# Patient Record
Sex: Female | Born: 1990 | Marital: Single | Smoking: Never smoker
Health system: Southern US, Community
[De-identification: ages and names within clinical notes are randomized; demographics above are authoritative.]

## PROBLEM LIST (undated history)

## (undated) DIAGNOSIS — F988 Other specified behavioral and emotional disorders with onset usually occurring in childhood and adolescence: Secondary | ICD-10-CM

## (undated) HISTORY — DX: Other specified behavioral and emotional disorders with onset usually occurring in childhood and adolescence: F98.8

## (undated) HISTORY — PX: WISDOM TOOTH EXTRACTION: SHX21

---

## 2011-08-22 ENCOUNTER — Telehealth: Payer: Self-pay

## 2011-08-22 NOTE — Telephone Encounter (Signed)
Spoke with pt Laura [name] msg pt wants refill on bc junel pt states missed a week of pills informed pt will consult ND and call her back pt voice understanding

## 2011-08-26 ENCOUNTER — Other Ambulatory Visit: Payer: Self-pay | Admitting: Obstetrics and Gynecology

## 2011-08-26 NOTE — Telephone Encounter (Signed)
Laura [name]/vph pt

## 2011-08-28 NOTE — Telephone Encounter (Signed)
Chart on your desk.

## 2011-08-28 NOTE — Telephone Encounter (Signed)
I need to see the patients chart.

## 2011-08-29 NOTE — Telephone Encounter (Signed)
Pt may have a refill on her BC.  She needs to start with her nenses and use back up for one month.  Refill for three months and she should have a follow up visit . Thanks

## 2011-09-03 ENCOUNTER — Other Ambulatory Visit: Payer: Self-pay

## 2011-09-03 ENCOUNTER — Telehealth: Payer: Self-pay

## 2011-09-03 MED ORDER — NORETHIN ACE-ETH ESTRAD-FE 1-20 MG-MCG PO TABS: 1.0000 | ORAL_TABLET | Freq: Every day | ORAL | Status: DC

## 2011-09-03 NOTE — Telephone Encounter (Signed)
Tc from pt pharmacy pt wants three months supply of bc due to insurance rx sent to pharm 3 month supply

## 2011-09-03 NOTE — Telephone Encounter (Signed)
Spoke with pt informed per ND ok to refill bc take with start of cycle use bum f/u 3-4 months pt voice understanding

## 2011-10-22 ENCOUNTER — Other Ambulatory Visit: Payer: Self-pay | Admitting: Obstetrics and Gynecology

## 2012-01-29 ENCOUNTER — Ambulatory Visit (INDEPENDENT_AMBULATORY_CARE_PROVIDER_SITE_OTHER): Payer: BC Managed Care – PPO | Admitting: Obstetrics and Gynecology

## 2012-01-29 ENCOUNTER — Encounter: Payer: Self-pay | Admitting: Obstetrics and Gynecology

## 2012-01-29 VITALS — BP 90/60 | Wt 112.0 lb

## 2012-01-29 DIAGNOSIS — N926 Irregular menstruation, unspecified: Secondary | ICD-10-CM

## 2012-01-29 DIAGNOSIS — B373 Candidiasis of vulva and vagina: Secondary | ICD-10-CM

## 2012-01-29 DIAGNOSIS — B3731 Acute candidiasis of vulva and vagina: Secondary | ICD-10-CM

## 2012-01-29 LAB — POCT WET PREP (WET MOUNT)
Clue Cells Wet Prep Whiff POC: POSITIVE
Trichomonas Wet Prep HPF POC: NEGATIVE

## 2012-01-29 MED ORDER — FLUCONAZOLE 150 MG PO TABS: 150.0000 mg | ORAL_TABLET | Freq: Once | ORAL | Status: DC

## 2012-01-29 MED ORDER — CLOTRIMAZOLE 1 % VA CREA: 1.0000 | TOPICAL_CREAM | Freq: Two times a day (BID) | VAGINAL | Status: DC

## 2012-01-29 NOTE — Progress Notes (Signed)
F/u birth control, pt c/o breakthrough bleeding. She has only missed one pill and it has been present for three months.  No pelvic pain.  She c/o itching and vulvar irritation with yellow discharge.       BP 90/60  Wt 112 lb (50.803 kg)  LMP 01/18/2012 Physical Examination: General appearance - alert, well appearing, and in no distress Chest - clear to auscultation, no wheezes, rales or rhonchi, symmetric air entry Heart - normal rate and regular rhythm Abdomen - soft, nontender, nondistended, no masses or organomegaly Pelvic - VULVA: normal appearing vulva with no masses, tenderness or lesions, VAGINA: vaginal discharge - copious, curd-like and green, WET MOUNT done - results: KOH done, lactobacilli, monilia, CERVIX: normal appearing cervix without discharge or lesions, UTERUS: uterus is normal size, shape, consistency and nontender, ADNEXA: normal adnexa in size, nontender and no masses Yeast vaginits Diflucan rx gc and chlam done HSV cx taken of vulva wher it was red and irritated.   Pt will call back if seh has any further abnormal bleeding in the next month

## 2012-01-29 NOTE — Patient Instructions (Signed)
Monilial Vaginitis Vaginitis in a soreness, swelling and redness (inflammation) of the vagina and vulva. Monilial vaginitis is not a sexually transmitted infection. CAUSES  Yeast vaginitis is caused by yeast (candida) that is normally found in your vagina. With a yeast infection, the candida has overgrown in number to a point that upsets the chemical balance. SYMPTOMS   White, thick vaginal discharge.  Swelling, itching, redness and irritation of the vagina and possibly the lips of the vagina (vulva).  Burning or painful urination.  Painful intercourse. DIAGNOSIS  Things that may contribute to monilial vaginitis are:  Postmenopausal and virginal states.  Pregnancy.  Infections.  Being tired, sick or stressed, especially if you had monilial vaginitis in the past.  Diabetes. Good control will help lower the chance.  Birth control pills.  Tight fitting garments.  Using bubble bath, feminine sprays, douches or deodorant tampons.  Taking certain medications that kill germs (antibiotics).  Sporadic recurrence can occur if you become ill. TREATMENT  Your caregiver will give you medication.  There are several kinds of anti monilial vaginal creams and suppositories specific for monilial vaginitis. For recurrent yeast infections, use a suppository or cream in the vagina 2 times a week, or as directed.  Anti-monilial or steroid cream for the itching or irritation of the vulva may also be used. Get your caregiver's permission.  Painting the vagina with methylene blue solution may help if the monilial cream does not work.  Eating yogurt may help prevent monilial vaginitis. HOME CARE INSTRUCTIONS   Finish all medication as prescribed.  Do not have sex until treatment is completed or after your caregiver tells you it is okay.  Take warm sitz baths.  Do not douche.  Do not use tampons, especially scented ones.  Wear cotton underwear.  Avoid tight pants and panty  hose.  Tell your sexual partner that you have a yeast infection. They should go to their caregiver if they have symptoms such as mild rash or itching.  Your sexual partner should be treated as well if your infection is difficult to eliminate.  Practice safer sex. Use condoms.  Some vaginal medications cause latex condoms to fail. Vaginal medications that harm condoms are:  Cleocin cream.  Butoconazole (Femstat).  Terconazole (Terazol) vaginal suppository.  Miconazole (Monistat) (may be purchased over the counter). SEEK MEDICAL CARE IF:   You have a temperature by mouth above 102 F (38.9 C).  The infection is getting worse after 2 days of treatment.  The infection is not getting better after 3 days of treatment.  You develop blisters in or around your vagina.  You develop vaginal bleeding, and it is not your menstrual period.  You have pain when you urinate.  You develop intestinal problems.  You have pain with sexual intercourse. Document Released: 11/06/2004 Document Revised: 04/21/2011 Document Reviewed: 07/21/2008 ExitCare Patient Information 2013 [employer].  

## 2012-01-30 LAB — GC/CHLAMYDIA PROBE AMP: GC Probe RNA: NEGATIVE

## 2012-02-05 ENCOUNTER — Telehealth: Payer: Self-pay

## 2012-02-05 ENCOUNTER — Telehealth: Payer: Self-pay | Admitting: Obstetrics and Gynecology

## 2012-02-05 NOTE — Telephone Encounter (Signed)
Lm on vm tcb rgd labs 

## 2012-02-05 NOTE — Telephone Encounter (Signed)
Message copied by Rolla Plate on Thu Feb 05, 2012  4:14 PM ------      Message from: Jaymes Graff      Created: Thu Feb 05, 2012  2:39 PM       Please call pt with results.  Thank you

## 2012-02-06 ENCOUNTER — Telehealth: Payer: Self-pay | Admitting: Obstetrics and Gynecology

## 2012-02-06 NOTE — Telephone Encounter (Signed)
Lm on vm tcb rgd labs 

## 2012-02-09 ENCOUNTER — Telehealth: Payer: Self-pay | Admitting: Obstetrics and Gynecology

## 2012-02-09 NOTE — Telephone Encounter (Signed)
Tc to pt. Re: test results. Informed pt that her hsv culture and gc/ct were all negative. Pt voiced understanding.

## 2012-02-13 ENCOUNTER — Telehealth: Payer: Self-pay | Admitting: Obstetrics and Gynecology

## 2012-02-13 ENCOUNTER — Other Ambulatory Visit: Payer: Self-pay | Admitting: Obstetrics and Gynecology

## 2012-02-13 NOTE — Telephone Encounter (Signed)
Spoke with pt rgd msg pt sates still having break thru bleeding wants to switch bc advised pt will consult  With nd and call her back pt voice understanding

## 2012-02-19 ENCOUNTER — Other Ambulatory Visit: Payer: Self-pay | Admitting: Obstetrics and Gynecology

## 2012-02-19 MED ORDER — NORETHIN ACE-ETH ESTRAD-FE 1-20 MG-MCG PO TABS: 1.0000 | ORAL_TABLET | Freq: Every day | ORAL | Status: DC

## 2012-02-19 NOTE — Telephone Encounter (Signed)
Spoke with pt rgd msg pt wants refill on bc and diflucan advised pt can refill bc need apt for diflucan or can try monistat otc pt will try monistat

## 2012-08-09 ENCOUNTER — Ambulatory Visit (INDEPENDENT_AMBULATORY_CARE_PROVIDER_SITE_OTHER): Payer: BC Managed Care – PPO | Admitting: Family Medicine

## 2012-08-09 VITALS — BP 102/64 | HR 81 | Temp 98.0°F | Resp 17 | Ht 62.0 in | Wt 114.0 lb

## 2012-08-09 DIAGNOSIS — Z283 Underimmunization status: Secondary | ICD-10-CM

## 2012-08-09 DIAGNOSIS — Z2839 Other underimmunization status: Secondary | ICD-10-CM

## 2012-08-09 DIAGNOSIS — Z111 Encounter for screening for respiratory tuberculosis: Secondary | ICD-10-CM

## 2012-08-09 NOTE — Progress Notes (Signed)
[facility]:  Office Visit  Chief Complaint:  Chief Complaint  Patient presents with  . Annual Exam    for school     HPI: Laura [name] is a 22 y.o. 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 Starts October 04, 2012.  No complaints, doing well.   History reviewed. No pertinent past medical history. History reviewed. No pertinent past surgical history. History   Social History  . Marital Status: Single    Spouse Name: N/A    Number of Children: N/A  . Years of Education: N/A   Social History Main Topics  . Smoking status: Never Smoker   . Smokeless tobacco: None  . Alcohol Use: No  . Drug Use: No  . Sexually Active: Yes    Birth Control/ Protection: Pill     Comment: junel    Other Topics Concern  . None   Social History Narrative  . None   Family History  Problem Relation Age of Onset  . Diabetes Maternal Grandmother    No Known Allergies Prior to Admission medications   Medication Sig Start Date End Date Taking? Authorizing Provider  BIOTIN PO Take by mouth.   Yes Historical Provider, MD  norethindrone-ethinyl estradiol (JUNEL FE 1/20) 1-20 MG-MCG tablet Take 1 tablet by mouth daily. 02/19/12 02/18/13 Yes Naima A Dillard, MD     ROS: The patient denies fevers, chills, night sweats, unintentional weight loss, chest pain, palpitations, wheezing, dyspnea on exertion, nausea, vomiting, abdominal pain, dysuria, hematuria, melena, numbness, weakness, or tingling.   All other systems have been reviewed and were otherwise negative with the exception of those mentioned in the HPI and as above.    PHYSICAL EXAM: Filed Vitals:   08/09/12 1720  BP: 102/64  Pulse: 81  Temp: 98 F (36.7 C)  Resp: 17   Filed Vitals:   08/09/12 1720  Height: 5\' 2"  (1.575 m)  Weight: 114 lb (51.71 kg)   Body mass index is 20.85 kg/(m^2).  General: Alert, no acute distress HEENT:  Normocephalic, atraumatic, oropharynx patent.  EOMI, PERRLA, fundoscopic exam normal.  Cardiovascular:  Regular rate and rhythm, no rubs murmurs or gallops.  No Carotid bruits, radial pulse intact. No pedal edema.  Respiratory: Clear to auscultation bilaterally.  No wheezes, rales, or rhonchi.  No cyanosis, no use of accessory musculature GI: No organomegaly, abdomen is soft and non-tender, positive bowel sounds.  No masses. Skin: No rashes. Neurologic: Facial musculature symmetric. Psychiatric: Patient is appropriate throughout our interaction. Lymphatic: No cervical lymphadenopathy Musculoskeletal: Gait intact. 5/5, no scoliosis.    LABS: Results for orders placed in visit on 01/29/12  GC/CHLAMYDIA PROBE AMP      Result Value Range   CT Probe RNA NEGATIVE     GC Probe RNA NEGATIVE    HERPES SIMPLEX VIRUS CULTURE      Result Value Range   Organism ID, Bacteria No Herpes Simplex Virus detected.    POCT URINE PREGNANCY      Result Value Range   Preg Test, Ur Negative    POCT WET PREP (WET MOUNT)      Result Value Range   Source Wet Prep POC       WBC, Wet Prep HPF POC       Bacteria Wet Prep HPF POC       BACTERIA WET PREP MORPHOLOGY POC       Clue Cells Wet Prep HPF POC  CLUE CELLS WET PREP WHIFF POC Positive Whiff     Yeast Wet Prep HPF POC Moderate     KOH Wet Prep POC negative     Trichomonas Wet Prep HPF POC negative      Tuberculosis Risk Questionnaire  1. Were you born outside the [place] in one of the following parts of the world: [place], [place], [place], [place] or [place]?  No  2. Have you traveled outside the [place] and lived for more than one month in one of the following parts of the world: [place], [place], [place], [place] or [place]?  No  3. Do you have a compromised immune system such as from any of the following conditions:HIV/AIDS, organ or bone marrow transplantation, diabetes, immunosuppressive medicines (e.g. Prednisone, Remicaide), leukemia, lymphoma, cancer of  the head or neck, gastrectomy or jejunal bypass, end-stage renal disease (on dialysis), or silicosis?  No   4. Have you ever or do you plan on working in: a residential care center, a health care facility, a jail or prison or homeless shelter?  Yes    5. Have you ever: injected illegal drugs, used crack cocaine, lived in a homeless shelter  or been in jail or prison?   No  6. Have you ever been exposed to anyone with infectious tuberculosis?  No   Tuberculosis Symptom Questionnaire  Do you currently have any of the following symptoms?  1. Unexplained cough lasting more than 3 weeks? No  2. Unexplained fever lasting more than 3 weeks. No   3. Night Sweats (sweating that leaves the bedclothes and sheets wet)   No  4. Shortness of Breath No  5. Chest Pain No  6. Unintentional weight loss  No  7. Unexplained fatigue (very tired for no reason) No    EKG/XRAY:   Primary read interpreted by Dr. Conley Rolls at [facility].   ASSESSMENT/PLAN: Encounter Diagnoses  Name Primary?  . Screening-pulmonary TB Yes  . Immunization deficiency    PPD test placed She will get Hep B titers She will return in 48-72 hrs, then once she is done she will return for anotehr TB test in 2 weeks.  F/u as directed    LE, THAO PHUONG, DO 08/09/2012 6:44 PM

## 2012-08-10 LAB — HEPATITIS B SURFACE ANTIGEN: Hepatitis B Surface Ag: NEGATIVE

## 2012-08-11 ENCOUNTER — Ambulatory Visit: Payer: BC Managed Care – PPO

## 2012-08-11 LAB — HEPATITIS B SURFACE ANTIBODY, QUANTITATIVE: Hep B S AB Quant (Post): 261.2 m[IU]/mL

## 2012-08-12 ENCOUNTER — Ambulatory Visit (INDEPENDENT_AMBULATORY_CARE_PROVIDER_SITE_OTHER): Payer: BC Managed Care – PPO | Admitting: *Deleted

## 2012-08-12 DIAGNOSIS — Z111 Encounter for screening for respiratory tuberculosis: Secondary | ICD-10-CM

## 2012-08-12 LAB — TB SKIN TEST
Induration: 0 mm
TB Skin Test: NEGATIVE

## 2012-08-26 ENCOUNTER — Ambulatory Visit (INDEPENDENT_AMBULATORY_CARE_PROVIDER_SITE_OTHER): Payer: BC Managed Care – PPO | Admitting: *Deleted

## 2012-08-26 DIAGNOSIS — Z111 Encounter for screening for respiratory tuberculosis: Secondary | ICD-10-CM

## 2012-08-26 NOTE — Progress Notes (Signed)
  Subjective:    Patient ID: Laura [name], female    DOB: 09-04-1990, 22 y.o.   MRN: [id number]  HPI Patient here today for a second step TB skin test. Pt has not had any positive test in the past. Pt advised to return Saturday 7/19 for the reading. She will have paperwork for school that will need to be filled out at her reading.   PPD placed on her left forearm.    Review of Systems     Objective:   Physical Exam        Assessment & Plan:

## 2012-08-28 ENCOUNTER — Ambulatory Visit (INDEPENDENT_AMBULATORY_CARE_PROVIDER_SITE_OTHER): Payer: BC Managed Care – PPO | Admitting: *Deleted

## 2012-08-28 DIAGNOSIS — Z111 Encounter for screening for respiratory tuberculosis: Secondary | ICD-10-CM

## 2012-08-28 LAB — TB SKIN TEST
Induration: 0 mm
TB Skin Test: NEGATIVE

## 2012-09-16 ENCOUNTER — Telehealth: Payer: Self-pay

## 2012-09-16 NOTE — Telephone Encounter (Signed)
Did you keep a copy to scan?

## 2012-09-16 NOTE — Telephone Encounter (Signed)
Advised mother of dates TB skin tests were performed

## 2012-09-16 NOTE — Telephone Encounter (Signed)
I do not have her immunization records.

## 2012-09-16 NOTE — Telephone Encounter (Signed)
Patient needs her immunizations expiration dates for completion of her college papers.   [phone]

## 2012-09-17 ENCOUNTER — Telehealth: Payer: Self-pay

## 2012-09-17 NOTE — Telephone Encounter (Signed)
Was advised yesterday PPD good for 1 yr, I think she needs immunization review. She indicates she is asking about the Tdap, I advised her I have no record of this.

## 2012-09-17 NOTE — Telephone Encounter (Signed)
Pt requesting information about immunizations and when they expire,etc   Best phone for pt is [phone] or moms number [phone]

## 2013-01-11 ENCOUNTER — Telehealth: Payer: Self-pay

## 2013-01-11 NOTE — Telephone Encounter (Signed)
She is immune to Hep B, called her to advise. Left message

## 2013-01-11 NOTE — Telephone Encounter (Signed)
Patient calling to get her titer results that were done back in June  [phone]

## 2013-07-01 ENCOUNTER — Ambulatory Visit (INDEPENDENT_AMBULATORY_CARE_PROVIDER_SITE_OTHER): Payer: BC Managed Care – PPO | Admitting: Internal Medicine

## 2013-07-01 VITALS — BP 100/68 | HR 75 | Temp 98.3°F | Resp 16 | Ht 62.0 in | Wt 113.0 lb

## 2013-07-01 DIAGNOSIS — F909 Attention-deficit hyperactivity disorder, unspecified type: Secondary | ICD-10-CM

## 2013-07-01 MED ORDER — AMPHETAMINE-DEXTROAMPHETAMINE 10 MG PO TABS: 10.0000 mg | ORAL_TABLET | Freq: Two times a day (BID) | ORAL | Status: DC

## 2013-07-01 NOTE — Progress Notes (Signed)
Subjective:    Patient ID: Laura [name], female    DOB: July 28, 1990, 23 y.o.   MRN: [id number] This chart was scribed for Laura Sia, MD by Valera Castle, ED Scribe. This patient was seen in room 01 and the patient's care was started at 2:44 PM.  Chief Complaint  Patient presents with  . ADD   HPI Laura [name] is a 23 y.o. female Pt reports with decreased concentration, onset several years ago.   She states she has a hard time focusing on one thing, states she will forget she is taking her tests. She reports running out of time with her tests sometimes. She states she sometimes has trouble studying at night, staying she stays up too late. She reports her body is growing accustomed to decreased sleep. She denies any sleep issues.   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 She denies having decreased concentration in high school, but states she started noticing her symptoms while attending college. She reports a small drop in her GPA then, but denies a substantial drop.   She states she is working in [place] during the Summer at [employer], denies having as many issues with concentration while at work, stating her employers are keeping track of her productivity more. She states for fun she spends time with her family and friends, visits downtown [place]. She denies reading much, stating she will often loose track of what she is reading. She reports being very fidgety while trying to sit still. She denies any of her friends saying she stops listening to them.   She has a brother and father without h/o ADD. She reports her mother has h/o similar symptoms.   She denies any other health issues.   PCP - Pcp Not In System  There are no active problems to display for this patient.  Prior to Admission medications   Medication Sig Start Date End Date Taking? Authorizing Provider  BIOTIN PO Take by mouth.   Yes  Historical Provider, MD  norethindrone-ethinyl estradiol (MICROGESTIN,JUNEL,LOESTRIN) 1-20 MG-MCG tablet Take 1 tablet by mouth daily.   Yes Historical Provider, MD  norethindrone-ethinyl estradiol (JUNEL FE 1/20) 1-20 MG-MCG tablet Take 1 tablet by mouth daily. 02/19/12 02/18/13  Michael Litter, MD   Review of Systems  Constitutional: Negative for fever.  Psychiatric/Behavioral: Positive for decreased concentration. Negative for sleep disturbance and agitation. The patient is not nervous/anxious.    exercise patterns very good Dietary patterns good   Objective:   Physical Exam  Nursing note and vitals reviewed. Constitutional: She is oriented to person, place, and time. She appears well-developed and well-nourished. No distress.  HENT:  Head: Normocephalic and atraumatic.  Eyes: Conjunctivae and EOM are normal. Pupils are equal, round, and reactive to light.  Neck: Neck supple. No thyromegaly present.  Cardiovascular: Normal rate, regular rhythm, normal heart sounds and intact distal pulses.   No murmur heard. Pulmonary/Chest: Effort normal and breath sounds normal. No respiratory distress. She has no wheezes. She has no rales.  Musculoskeletal: Normal range of motion. She exhibits no tenderness.  Lymphadenopathy:    She has no cervical adenopathy.  Neurological: She is alert and oriented to person, place, and time. No cranial nerve deficit.  Skin: Skin is warm and dry.  Psychiatric: She has a normal mood and affect. Her behavior is normal.   BP 100/68  Pulse 75  Temp(Src) 98.3 F (36.8 C) (Oral)  Resp 16  Ht  5\' 2"  (1.575 m)  Wt 113 lb (51.256 kg)  BMI 20.66 kg/m2  SpO2 100%  LMP 06/01/2013  Adult self-report scale for ADHD scanned- very positive at 1452 with an endorsement of hyperactivity as well as attention problems that are very significant    Assessment & Plan:  ADHD (attention deficit hyperactivity disorder)  Meds ordered this encounter  Medications  .  norethindrone-ethinyl estradiol (MICROGESTIN,JUNEL,LOESTRIN) 1-20 MG-MCG tablet    Sig: Take 1 tablet by mouth daily.  Marland Kitchen. amphetamine-dextroamphetamine (ADDERALL) 10 MG tablet    Sig: Take 1 tablet (10 mg total) by mouth 2 (two) times daily with a meal.    Dispense:  60 tablet    Refill:  0  . amphetamine-dextroamphetamine (ADDERALL) 10 MG tablet    Sig: Take 1 tablet (10 mg total) by mouth 2 (two) times daily with a meal. For 30d from date signed    Dispense:  60 tablet    Refill:  0  . amphetamine-dextroamphetamine (ADDERALL) 10 MG tablet    Sig: Take 1 tablet (10 mg total) by mouth 2 (two) times daily with a meal. For 60 d from signing    Dispense:  60 tablet    Refill:  0   She will self tit[place] in [place] this [place]/and working as an Tax inspectorintern at [employer], and will communicate with me through my chart or telephone. Planned followup August or September 2015 for further dose adjustments. Her mother lives here and may be a conduit for medication if needed.     I have completed the patient encounter in its entirety as documented by the scribe, with editing by me where necessary. Quanisha Drewry P. Merla Richesoolittle, M.D.

## 2013-07-02 DIAGNOSIS — F909 Attention-deficit hyperactivity disorder, unspecified type: Secondary | ICD-10-CM | POA: Insufficient documentation

## 2013-07-06 ENCOUNTER — Telehealth: Payer: Self-pay

## 2013-07-06 NOTE — Telephone Encounter (Signed)
Patient is calling regarding her adderall rx. Patient states the pharmacy told her that the doctor has not signed off on the refill. Please return call and advise. Thank you.

## 2013-07-07 NOTE — Telephone Encounter (Signed)
Lm for rtn call 

## 2013-07-07 NOTE — Telephone Encounter (Signed)
Look at 5/22 ov---not sure what she means

## 2013-07-12 NOTE — Telephone Encounter (Signed)
Pt clarified that a PA is needed for ins coverage. I advised pt that I have not gotten a fax from pharm w/ins info and she stated that she will call them back and have them fax it to me.

## 2013-07-12 NOTE — Telephone Encounter (Signed)
PA approved for 36 mos. Notified pt and pharmacy.

## 2014-01-31 ENCOUNTER — Ambulatory Visit (INDEPENDENT_AMBULATORY_CARE_PROVIDER_SITE_OTHER): Payer: BC Managed Care – PPO | Admitting: Family Medicine

## 2014-01-31 VITALS — BP 110/60 | HR 85 | Temp 98.6°F | Resp 16 | Ht 63.0 in | Wt 114.6 lb

## 2014-01-31 DIAGNOSIS — F900 Attention-deficit hyperactivity disorder, predominantly inattentive type: Secondary | ICD-10-CM

## 2014-01-31 MED ORDER — AMPHETAMINE-DEXTROAMPHETAMINE 10 MG PO TABS: 10.0000 mg | ORAL_TABLET | Freq: Two times a day (BID) | ORAL | Status: DC

## 2014-01-31 NOTE — Patient Instructions (Signed)
Good to see you today!  Take care and let us know if you have any concerns  Best of luck with the rest of your schooling!

## 2014-01-31 NOTE — Progress Notes (Signed)
[facility] [address], [place] [id number] [phone]  Date:  01/31/2014   Name:  Laura [name]   DOB:  1990-09-08   MRN:  [id number]  PCP:  Pcp Not In System    Chief Complaint: Medication Refill   History of Present Illness:  Laura [name] is a 23 y.o. very pleasant female patient who presents with the following:  Here today seeking an adderall refill.  History of ADHD- she has been on adderall for a few months.   She does not use it every day- the 3 rx she was given in May have lasted until now.   She does feel like the medication is helping her.  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  She is is here visiting her family She is generally in good health  BP Readings from Last 3 Encounters:  01/31/14 94/44  07/01/13 100/68  08/09/12 102/64    Patient Active Problem List   Diagnosis Date Noted  . ADHD (attention deficit hyperactivity disorder) 07/02/2013    No past medical history on file.  No past surgical history on file.  History  Substance Use Topics  . Smoking status: Never Smoker   . Smokeless tobacco: Not on file  . Alcohol Use: No    Family History  Problem Relation Age of Onset  . Diabetes Maternal Grandmother     No Known Allergies  Medication list has been reviewed and updated.  Current Outpatient Prescriptions on File Prior to Visit  Medication Sig Dispense Refill  . amphetamine-dextroamphetamine (ADDERALL) 10 MG tablet Take 1 tablet (10 mg total) by mouth 2 (two) times daily with a meal. 60 tablet 0  . amphetamine-dextroamphetamine (ADDERALL) 10 MG tablet Take 1 tablet (10 mg total) by mouth 2 (two) times daily with a meal. For 30d from date signed 60 tablet 0  . amphetamine-dextroamphetamine (ADDERALL) 10 MG tablet Take 1 tablet (10 mg total) by mouth 2 (two) times daily with a meal. For 60 d from signing 60 tablet 0  . BIOTIN PO Take by mouth.    .  norethindrone-ethinyl estradiol (MICROGESTIN,JUNEL,LOESTRIN) 1-20 MG-MCG tablet Take 1 tablet by mouth daily.    . norethindrone-ethinyl estradiol (JUNEL FE 1/20) 1-20 MG-MCG tablet Take 1 tablet by mouth daily. 3 Package 0   No current facility-administered medications on file prior to visit.    Review of Systems:  As per HPI- otherwise negative.   Physical Examination: Filed Vitals:   01/31/14 1251  BP: 94/44  Pulse: 85  Temp: 98.6 F (37 C)  Resp: 16   Filed Vitals:   01/31/14 1251  Height: 5\' 3"  (1.6 m)  Weight: 114 lb 9.6 oz (51.982 kg)   Body mass index is 20.31 kg/(m^2). Ideal Body Weight: Weight in (lb) to have BMI = 25: 140.8  GEN: WDWN, NAD, Non-toxic, A & O x 3, slim build HEENT: Atraumatic, Normocephalic. Neck supple. No masses, No LAD. Ears and Nose: No external deformity. CV: RRR, No M/G/R. No JVD. No thrill. No extra heart sounds. PULM: CTA B, no wheezes, crackles, rhonchi. No retractions. No resp. distress. No accessory muscle use. EXTR: No c/c/e NEURO Normal gait.  PSYCH: Normally interactive. Conversant. Not depressed or anxious appearing.  Calm demeanor.    Assessment and Plan: Attention deficit hyperactivity disorder (ADHD), predominantly inattentive type - Plan: amphetamine-dextroamphetamine (ADDERALL) 10 MG tablet, amphetamine-dextroamphetamine (ADDERALL) 10 MG  tablet, amphetamine-dextroamphetamine (ADDERALL) 10 MG tablet  Refilled her adderall for 3 months, she is doing well with her current dose.  She will call in about 3 months for RF, face to face in 6 months   Signed Abbe AmsterdamJessica Copland, MD

## 2014-04-12 ENCOUNTER — Telehealth: Payer: Self-pay

## 2014-04-12 DIAGNOSIS — F900 Attention-deficit hyperactivity disorder, predominantly inattentive type: Secondary | ICD-10-CM

## 2014-04-12 NOTE — Telephone Encounter (Signed)
Will you replace the lost Rx? Or should she wait until it is due?

## 2014-04-12 NOTE — Telephone Encounter (Signed)
Patient lost her adderall prescription.   Needs another one now - her mom can pick up the script along with the next three months.   [phone]

## 2014-04-13 NOTE — Telephone Encounter (Signed)
Called her back.   I am willing to write 3 more months total, but she will then have to be seen.  Advised her that she cannot lose any further rx; I will not replace again. Her mother will come by to pick up the rx tomorrow

## 2014-04-14 MED ORDER — AMPHETAMINE-DEXTROAMPHETAMINE 10 MG PO TABS: 10.0000 mg | ORAL_TABLET | Freq: Two times a day (BID) | ORAL | Status: DC

## 2014-08-31 ENCOUNTER — Ambulatory Visit (INDEPENDENT_AMBULATORY_CARE_PROVIDER_SITE_OTHER): Payer: BLUE CROSS/BLUE SHIELD | Admitting: Family Medicine

## 2014-08-31 VITALS — BP 110/62 | HR 76 | Temp 98.0°F | Resp 18 | Ht 62.5 in | Wt 116.0 lb

## 2014-08-31 DIAGNOSIS — F909 Attention-deficit hyperactivity disorder, unspecified type: Secondary | ICD-10-CM

## 2014-08-31 NOTE — Progress Notes (Signed)
  Subjective:  Patient ID: Laura [name], female    DOB: Oct 29, 1990  Age: 24 y.o. MRN: [id number]  Patient has not seen Dr. Merla [name] for over a year. He was the original prescriber. We talked a long time about the fact that I can only give a 30 day prescription, she needs to see him to get sufficient refills to tide her through until Christmas time. She will see him tomorrow.   Objective:   .  Assessment & Plan:   Assessment: .  Plan: Patient Instructions  Patient is to follow-up with Dr. Merla [name] tomorrow. She has not seen him since he initially prescribed her with ADHD medications, and I feel it is best for him to continue to manage her medications since I can only give her a brief refill and she will not be in town after this weekend to see him.  [facility] Policy for Prescribing Controlled Substances (Revised 12/2011) 1. Prescriptions for controlled substances will be filled by ONE provider at [facility] with whom you have established and developed a plan for your care, including follow-up. 2. You are encouraged to schedule an appointment with your prescriber at our appointment center for follow-up visits whenever possible. 3. If you request a prescription for the controlled substance while at [facility] for an acute problem (with someone other than your regular prescriber), you MAY be given a ONE-TIME prescription for a 30-day supply of the controlled substance, to allow time for you to return to see your regular prescriber for additional prescriptions.     Laura Hukill, MD 08/31/2014

## 2014-08-31 NOTE — Patient Instructions (Signed)
Patient is to follow-up with Dr. Merla Riches tomorrow. She has not seen him since he initially prescribed her with ADHD medications, and I feel it is best for him to continue to manage her medications since I can only give her a brief refill and she will not be in town after this weekend to see him.  [facility] Policy for Prescribing Controlled Substances (Revised 12/2011) 1. Prescriptions for controlled substances will be filled by ONE provider at [facility] with whom you have established and developed a plan for your care, including follow-up. 2. You are encouraged to schedule an appointment with your prescriber at our appointment center for follow-up visits whenever possible. 3. If you request a prescription for the controlled substance while at [facility] for an acute problem (with someone other than your regular prescriber), you MAY be given a ONE-TIME prescription for a 30-day supply of the controlled substance, to allow time for you to return to see your regular prescriber for additional prescriptions.

## 2014-09-01 ENCOUNTER — Ambulatory Visit (INDEPENDENT_AMBULATORY_CARE_PROVIDER_SITE_OTHER): Payer: BLUE CROSS/BLUE SHIELD | Admitting: Internal Medicine

## 2014-09-01 VITALS — BP 96/58 | HR 65 | Temp 98.8°F | Resp 16 | Ht 62.0 in | Wt 114.0 lb

## 2014-09-01 DIAGNOSIS — F900 Attention-deficit hyperactivity disorder, predominantly inattentive type: Secondary | ICD-10-CM

## 2014-09-01 MED ORDER — AMPHETAMINE-DEXTROAMPHETAMINE 10 MG PO TABS: 10.0000 mg | ORAL_TABLET | Freq: Two times a day (BID) | ORAL | Status: DC

## 2014-09-01 NOTE — Progress Notes (Signed)
   Subjective:  This chart was scribed for Laura Sia, MD by Broadus John, Medical Scribe. This patient was seen in Room 14 and the patient's care was started at 11:20 AM.   Patient ID: Laura [name], female    DOB: [birth year], 24 y.o.   MRN: [id number]  HPI HPI Comments: Laura [name] is a 24 y.o. female who presents to [facility] for a medication refill for Adderall.  She was started on Adderall one year ago after evaluation diagnosing attention deficit disorder. Pt notes that she takes  half a 10 mg pill in the morning and one half after school. She denies having any symptoms such as tremors, weakness, abdominal complications with taking the medication. Otherwise she does not have any complaints.  She is pleased with her ability to do academic work without distraction by using this medication.  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 She states that she is thinking of either living in [place] or [place] after school.    The remainder of her health is good  Review of Systems  Constitutional: Negative for diaphoresis, appetite change and unexpected weight change.  Eyes: Negative for visual disturbance.  Cardiovascular: Negative for chest pain and palpitations.  Gastrointestinal: Negative for diarrhea.  Neurological: Negative for tremors and headaches.  Psychiatric/Behavioral: Negative for sleep disturbance and dysphoric mood.      Objective:   Physical Exam  Constitutional: She is oriented to person, place, and time. She appears well-developed and well-nourished. No distress.  HENT:  Head: Normocephalic and atraumatic.  Eyes: EOM are normal. Pupils are equal, round, and reactive to light.  Neck: Neck supple.  Cardiovascular: Normal rate.   Pulmonary/Chest: Effort normal.  Neurological: She is alert and oriented to person, place, and time. No cranial nerve deficit.  Skin: Skin is warm and dry.  Psychiatric: She has a normal mood and  affect. Her behavior is normal.  Nursing note and vitals reviewed. BP 96/58 mmHg  Pulse 65  Temp(Src) 98.8 F (37.1 C)  Resp 16  Ht  (1.575 m)  Wt 114 lb (51.71 kg)  BMI 20.85 kg/m2  SpO2 99%  LMP 08/11/2014     Assessment & Plan:  I have completed the patient encounter in its entirety as documented by the scribe, with editing by me where necessary. Robert P. Merla Riches, M.D.  Attention deficit hyperactivity disorder (ADHD), predominantly inattentive type - Plan: amphetamine-dextroamphetamine (ADDERALL) 10 MG tablet, amphetamine-dextroamphetamine (ADDERALL) 10 MG tablet, amphetamine-dextroamphetamine (ADDERALL) 10 MG tablet  Meds ordered this encounter  Medications  . amphetamine-dextroamphetamine (ADDERALL) 10 MG tablet    Sig: Take 1 tablet (10 mg total) by mouth 2 (two) times daily with a meal.    Dispense:  60 tablet    Refill:  0  . amphetamine-dextroamphetamine (ADDERALL) 10 MG tablet    Sig: Take 1 tablet (10 mg total) by mouth 2 (two) times daily with a meal. For 30d from date signed    Dispense:  60 tablet    Refill:  0  . amphetamine-dextroamphetamine (ADDERALL) 10 MG tablet    Sig: Take 1 tablet (10 mg total) by mouth 2 (two) times daily with a meal. For 60 d from signing    Dispense:  60 tablet    Refill:  0   she may follow-up through my chart or by phone with recheck in one year

## 2014-12-18 ENCOUNTER — Telehealth: Payer: Self-pay | Admitting: Internal Medicine

## 2014-12-18 DIAGNOSIS — F900 Attention-deficit hyperactivity disorder, predominantly inattentive type: Secondary | ICD-10-CM

## 2014-12-18 MED ORDER — AMPHETAMINE-DEXTROAMPHETAMINE 10 MG PO TABS: 10.0000 mg | ORAL_TABLET | Freq: Two times a day (BID) | ORAL | Status: DC

## 2014-12-18 NOTE — Telephone Encounter (Signed)
Meds ordered this encounter  Medications  . amphetamine-dextroamphetamine (ADDERALL) 10 MG tablet    Sig: Take 1 tablet (10 mg total) by mouth 2 (two) times daily with a meal.    Dispense:  60 tablet    Refill:  0  . amphetamine-dextroamphetamine (ADDERALL) 10 MG tablet    Sig: Take 1 tablet (10 mg total) by mouth 2 (two) times daily with a meal. For 30d from date signed    Dispense:  60 tablet    Refill:  0  . amphetamine-dextroamphetamine (ADDERALL) 10 MG tablet    Sig: Take 1 tablet (10 mg total) by mouth 2 (two) times daily with a meal. For 60 d from signing    Dispense:  60 tablet    Refill:  0

## 2014-12-18 NOTE — Telephone Encounter (Signed)
Adderall refill. Patient's mother will be picking up her medication.  [phone]

## 2014-12-19 NOTE — Telephone Encounter (Signed)
Pt.notified

## 2015-04-11 ENCOUNTER — Telehealth: Payer: Self-pay

## 2015-04-11 DIAGNOSIS — F900 Attention-deficit hyperactivity disorder, predominantly inattentive type: Secondary | ICD-10-CM

## 2015-04-11 NOTE — Telephone Encounter (Signed)
Pt in need refill on her ADDERALL 10 MG. Please call [phone]

## 2015-04-12 MED ORDER — AMPHETAMINE-DEXTROAMPHETAMINE 10 MG PO TABS: 10.0000 mg | ORAL_TABLET | Freq: Two times a day (BID) | ORAL | Status: DC

## 2015-04-12 NOTE — Telephone Encounter (Signed)
Meds ordered this encounter  Medications  . amphetamine-dextroamphetamine (ADDERALL) 10 MG tablet    Sig: Take 1 tablet (10 mg total) by mouth 2 (two) times daily with a meal.    Dispense:  60 tablet    Refill:  0  . amphetamine-dextroamphetamine (ADDERALL) 10 MG tablet    Sig: Take 1 tablet (10 mg total) by mouth 2 (two) times daily with a meal. For 30d from date signed    Dispense:  60 tablet    Refill:  0  . amphetamine-dextroamphetamine (ADDERALL) 10 MG tablet    Sig: Take 1 tablet (10 mg total) by mouth 2 (two) times daily with a meal. For 60 d from signing    Dispense:  60 tablet    Refill:  0   Yearly f/u 3 months next

## 2015-07-31 ENCOUNTER — Ambulatory Visit (INDEPENDENT_AMBULATORY_CARE_PROVIDER_SITE_OTHER): Payer: Self-pay | Admitting: Internal Medicine

## 2015-07-31 VITALS — BP 102/66 | HR 99 | Temp 98.3°F | Resp 18 | Ht 62.0 in | Wt 118.0 lb

## 2015-07-31 DIAGNOSIS — F900 Attention-deficit hyperactivity disorder, predominantly inattentive type: Secondary | ICD-10-CM

## 2015-07-31 MED ORDER — AMPHETAMINE-DEXTROAMPHETAMINE 10 MG PO TABS: 10.0000 mg | ORAL_TABLET | Freq: Two times a day (BID) | ORAL | Status: DC

## 2015-07-31 NOTE — Progress Notes (Signed)
Subjective:  By signing my name below, I, [name], attest that this documentation has been prepared under the direction and in the presence of Ellamae Siaobert Doolittle, MD.  Electronically Signed: [name], ED Scribe. 07/31/2015. 8:16 AM.   Patient ID: Laura [name], female    DOB: 06-Nov-1990, 25 y.o.   MRN: [id number]  HPI Chief Complaint  Patient presents with  . Medication Refill    Adderall   HPI Comments: Laura [name] is a 25 y.o. female who presents to the [facility] for med refill of adderall 10 mg bid. Last seen by me 08/2014 but most recent med refill 11/07. Pt states she did well this last semester. She states she had a challenging course where she had to write more so than answer in multiple choice. Due to doing poorly in this course she will have to retake it. She is back in [place]. Pt denies needing a change in medication. She is doing well at current dose. No adverse effect. Pt is healthy otherwise.    Pharmacy school  Patient Active Problem List   Diagnosis Date Noted  . ADHD (attention deficit hyperactivity disorder) 07/02/2013   Past Medical History  Diagnosis Date  . ADD (attention deficit disorder)    Past Surgical History  Procedure Laterality Date  . Wisdom tooth extraction     No Known Allergies Prior to Admission medications   Medication Sig Start Date End Date Taking? Authorizing Provider  amphetamine-dextroamphetamine (ADDERALL) 10 MG tablet Take 1 tablet (10 mg total) by mouth 2 (two) times daily with a meal. 04/12/15   Tonye Pearsonobert P Doolittle, MD  amphetamine-dextroamphetamine (ADDERALL) 10 MG tablet Take 1 tablet (10 mg total) by mouth 2 (two) times daily with a meal. For 30d from date signed 04/12/15   Tonye Pearsonobert P Doolittle, MD  amphetamine-dextroamphetamine (ADDERALL) 10 MG tablet Take 1 tablet (10 mg total) by mouth 2 (two) times daily with a meal. For 60 d from signing 04/12/15   Tonye Pearsonobert P Doolittle, MD  norethindrone-ethinyl estradiol  (MICROGESTIN,JUNEL,LOESTRIN) 1-20 MG-MCG tablet Take 1 tablet by mouth daily.    Historical Provider, MD    Review of Systems [place] Objective:   Physical Exam  Constitutional: She is oriented to person, place, and time. She appears well-developed and well-nourished. No distress.  HENT:  Head: Normocephalic and atraumatic.  Eyes: Conjunctivae and EOM are normal.  Neck: Neck supple.  Cardiovascular: Normal rate.   Pulmonary/Chest: Effort normal.  Musculoskeletal: Normal range of motion.  Neurological: She is alert and oriented to person, place, and time.  Skin: Skin is warm and dry.  Psychiatric: She has a normal mood and affect. Her behavior is normal.  Nursing note and vitals reviewed.    Filed Vitals:   07/31/15 0819  BP: 102/66  Pulse: 99  Temp: 98.3 F (36.8 C)  TempSrc: Oral  Resp: 18  Height: 5\' 2"  (1.575 m)  Weight: 118 lb (53.524 kg)  SpO2: 100%    Assessment & Plan:  Attention deficit hyperactivity disorder (ADHD), predominantly inattentive type - Plan: amphetamine-dextroamphetamine (ADDERALL) 10 MG tablet, amphetamine-dextroamphetamine (ADDERALL) 10 MG tablet, amphetamine-dextroamphetamine (ADDERALL) 10 MG tablet  Meds ordered this encounter  Medications  . amphetamine-dextroamphetamine (ADDERALL) 10 MG tablet    Sig: Take 1 tablet (10 mg total) by mouth 2 (two) times daily with a meal.    Dispense:  60 tablet    Refill:  0  . amphetamine-dextroamphetamine (ADDERALL) 10 MG tablet    Sig: Take 1 tablet (  10 mg total) by mouth 2 (two) times daily with a meal. For 30d from date signed    Dispense:  60 tablet    Refill:  0  . amphetamine-dextroamphetamine (ADDERALL) 10 MG tablet    Sig: Take 1 tablet (10 mg total) by mouth 2 (two) times daily with a meal. For 60 d from signing    Dispense:  60 tablet    Refill:  0   f/u 1 yr w/ Herma Ard  I have completed the patient encounter in its entirety as documented by the scribe, with editing by me where necessary. Robert  P. Merla Riches, M.D.

## 2015-07-31 NOTE — Patient Instructions (Addendum)
When you return for followup, ask for Benny LennertSarah Weber PA-C as I will be retired!!!!!!    IF you received an x-ray today, you will receive an invoice from [facility]. Please contact [facility] at [phone] with questions or concerns regarding your invoice.   IF you received labwork today, you will receive an invoice from [facility]. Please contact [facility] at [phone] with questions or concerns regarding your invoice.   Our billing staff will not be able to assist you with questions regarding bills from these companies.  You will be contacted with the lab results as soon as they are available. The fastest way to get your results is to activate your My Chart account. Instructions are located on the last page of this paperwork. If you have not heard from us regarding the results in 2 weeks, please contact this office.

## 2016-01-30 ENCOUNTER — Ambulatory Visit (INDEPENDENT_AMBULATORY_CARE_PROVIDER_SITE_OTHER): Payer: Self-pay | Admitting: Family Medicine

## 2016-01-30 ENCOUNTER — Telehealth: Payer: Self-pay | Admitting: *Deleted

## 2016-01-30 VITALS — BP 116/60 | HR 80 | Temp 98.2°F | Resp 16 | Ht 62.0 in | Wt 122.0 lb

## 2016-01-30 DIAGNOSIS — F988 Other specified behavioral and emotional disorders with onset usually occurring in childhood and adolescence: Secondary | ICD-10-CM

## 2016-01-30 DIAGNOSIS — Z23 Encounter for immunization: Secondary | ICD-10-CM

## 2016-01-30 DIAGNOSIS — F900 Attention-deficit hyperactivity disorder, predominantly inattentive type: Secondary | ICD-10-CM

## 2016-01-30 MED ORDER — AMPHETAMINE-DEXTROAMPHETAMINE 10 MG PO TABS: 10.0000 mg | ORAL_TABLET | Freq: Two times a day (BID) | ORAL | 0 refills | Status: DC

## 2016-01-30 NOTE — Progress Notes (Signed)
Subjective:  By signing my name below, I, Laura [name], attest that this documentation has been prepared under the direction and in the presence of Laura StaggersJeffrey Sheyanne Munley, MD.  Electronically Signed: Andrew Auaven [name], ED Scribe. 01/30/2016. 8:19 AM.  Patient ID: Laura [name], female    DOB: 1990-10-25, 25 y.o.   MRN: [id number]  HPI   Chief Complaint  Patient presents with  . Immunizations    tb needed for school  . Medication Refill    adderall   HPI Comments: Laura [name] is a 25 y.o. female who presents to the [facility] for a medication refill.  Immunization review  Immunization History  Administered Date(s) Administered  . PPD Test 08/09/2012, 08/26/2012   TDP- unsure of previous TDAP, but will update today. Hep B -11/2002,12/2002, 05/2003 Positive titer 07/2012 Varicella- 09/22/2003 Gardisil- 2010, all 3   ADHD Prev followed by Dr. Merla [name]. Last visit in June, had done well the prev semester. Did have struggled with one course requiring more writing. She is in [place] for school. She ws continue on adderall 10 mg bid. Advised to f/u with Laura [name] within 1 year.  Pt is tolerating medication well without adverse effects. She typically takes 1st dose at 9am and 2nd dose at 5pm when she is about to study. She does not take medication on weekends unless she needs to study. She denies trouble sleeping. She eats 2-3 times a day. She denies CP and palpitations and depression.   Pt attends [employer]. 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 She is current an Tax inspectorintern at [employer] and plans to continue to work there after graduating.  Patient Active Problem List   Diagnosis Date Noted  . ADHD (attention deficit hyperactivity disorder) 07/02/2013   Past Medical History:  Diagnosis Date  . ADD (attention deficit disorder)    Past Surgical History:  Procedure Laterality Date  . WISDOM TOOTH EXTRACTION     No Known Allergies Prior to Admission  medications   Medication Sig Start Date End Date Taking? Authorizing Provider  amphetamine-dextroamphetamine (ADDERALL) 10 MG tablet Take 1 tablet (10 mg total) by mouth 2 (two) times daily with a meal. 07/31/15  Yes Laura Pearsonobert P Doolittle, MD  amphetamine-dextroamphetamine (ADDERALL) 10 MG tablet Take 1 tablet (10 mg total) by mouth 2 (two) times daily with a meal. For 30d from date signed 07/31/15  Yes Laura Pearsonobert P Doolittle, MD  amphetamine-dextroamphetamine (ADDERALL) 10 MG tablet Take 1 tablet (10 mg total) by mouth 2 (two) times daily with a meal. For 60 d from signing 07/31/15  Yes Laura Pearsonobert P Doolittle, MD  norethindrone-ethinyl estradiol (MICROGESTIN,JUNEL,LOESTRIN) 1-20 MG-MCG tablet Take 1 tablet by mouth daily.   Yes Historical Provider, MD   Social History   Social History  . Marital status: Single    Spouse name: N/A  . Number of children: N/A  . Years of education: N/A   Occupational History  . Not on file.   Social History Main Topics  . Smoking status: Never Smoker  . Smokeless tobacco: Not on file  . Alcohol use No  . Drug use: No  . Sexual activity: Yes    Birth control/ protection: Pill     Comment: junel    Other Topics Concern  . Not on file   Social History Narrative  . No narrative on file   Review of Systems  Constitutional: Negative for appetite change.  Cardiovascular: Negative for chest pain and palpitations.  Psychiatric/Behavioral: Negative  for dysphoric mood and sleep disturbance. The patient is not nervous/anxious.        Objective:   Physical Exam  Constitutional: She is oriented to person, place, and time. She appears well-developed and well-nourished. No distress.  HENT:  Head: Normocephalic and atraumatic.  Eyes: Conjunctivae and EOM are normal.  Neck: Neck supple.  Cardiovascular: Normal rate, regular rhythm and normal heart sounds.  Exam reveals no gallop and no friction rub.   No murmur heard. Pulmonary/Chest: Effort normal and breath sounds  normal. She has no wheezes. She has no rales.  Musculoskeletal: Normal range of motion.  Neurological: She is alert and oriented to person, place, and time.  Skin: Skin is warm and dry.  Psychiatric: She has a normal mood and affect. Her behavior is normal.  Nursing note and vitals reviewed.  Vitals:   01/30/16 0809  BP: 116/60  Pulse: 80  Resp: 16  Temp: 98.2 F (36.8 C)  TempSrc: Oral  SpO2: 98%  Weight: 122 lb (55.3 kg)  Height: 5\' 2"  (1.575 m)    Assessment & Plan:   Laura [name] is a 25 y.o. female Attention deficit disorder, unspecified hyperactivity presence  - stable, No new side effects of medication. Symptoms are controlled. Continue 10 mg twice a day Adderall, can decrease to half dose in the afternoon, or avoid second dose if not requiring for studying and off weekends if needed. Follow-up in 6 months, call for refill in 3 months if symptoms are stable.  Need for Tdap vaccination - Plan: Tdap vaccine greater than or equal to 7yo IM  - tdap given, paperwork completed.  Attention deficit hyperactivity disorder (ADHD), predominantly inattentive type - Plan: amphetamine-dextroamphetamine (ADDERALL) 10 MG tablet, amphetamine-dextroamphetamine (ADDERALL) 10 MG tablet, amphetamine-dextroamphetamine (ADDERALL) 10 MG tablet  - as above.   Meds ordered this encounter  Medications  . amphetamine-dextroamphetamine (ADDERALL) 10 MG tablet    Sig: Take 1 tablet (10 mg total) by mouth 2 (two) times daily with a meal.    Dispense:  60 tablet    Refill:  0  . amphetamine-dextroamphetamine (ADDERALL) 10 MG tablet    Sig: Take 1 tablet (10 mg total) by mouth 2 (two) times daily with a meal. For 30d from date signed    Dispense:  60 tablet    Refill:  0  . amphetamine-dextroamphetamine (ADDERALL) 10 MG tablet    Sig: Take 1 tablet (10 mg total) by mouth 2 (two) times daily with a meal. For 60 d from signing    Dispense:  60 tablet    Refill:  0   Patient Instructions    Congrats on the Dean's List!  Continue same dose of Adderall at this time. As we discussed you can take a half a pill in the afternoon if you're not having to study as late or taking that dose later. Follow-up in 6 months, but call in 3 months for repeat prescriptions as long as your symptoms are stable.  Tetanus booster (Tdap) was given today. Your paperwork appears to be complete exceptit appears your school needs proof of 3 total tetanus boosters. Your previous school may have the information that you can attach to that form. Let me know if you have any questions.    IF you received an x-ray today, you will receive an invoice from [facility]. Please contact [facility] at [phone] with questions or concerns regarding your invoice.   IF you received labwork today, you will receive an invoice from [facility].  Please contact [facility] at [phone] with questions or concerns regarding your invoice.   Our billing staff will not be able to assist you with questions regarding bills from these companies.  You will be contacted with the lab results as soon as they are available. The fastest way to get your results is to activate your My Chart account. Instructions are located on the last page of this paperwork. If you have not heard from us regarding the results in 2 weeks, please contact this office.        I personally performed the services described in this documentation, which was scribed in my presence. The recorded information has been reviewed and considered, and addended by me as needed.   Signed,   Laura StaggersJeffrey Jurney Overacker, MD [facility].  01/30/16 10:21 AM

## 2016-01-30 NOTE — Patient Instructions (Addendum)
Congrats on the Dean's List!  Continue same dose of Adderall at this time. As we discussed you can take a half a pill in the afternoon if you're not having to study as late or taking that dose later. Follow-up in 6 months, but call in 3 months for repeat prescriptions as long as your symptoms are stable.  Tetanus booster (Tdap) was given today. Your paperwork appears to be complete exceptit appears your school needs proof of 3 total tetanus boosters. Your previous school may have the information that you can attach to that form. Let me know if you have any questions.    IF you received an x-ray today, you will receive an invoice from [facility]. Please contact [facility] at [phone] with questions or concerns regarding your invoice.   IF you received labwork today, you will receive an invoice from [facility]. Please contact [facility] at [phone] with questions or concerns regarding your invoice.   Our billing staff will not be able to assist you with questions regarding bills from these companies.  You will be contacted with the lab results as soon as they are available. The fastest way to get your results is to activate your My Chart account. Instructions are located on the last page of this paperwork. If you have not heard from us regarding the results in 2 weeks, please contact this office.

## 2016-01-30 NOTE — Telephone Encounter (Signed)
Faxed completed immunization compliance form to [facility]. Confirmation page received at 8:51 am.

## 2016-09-29 ENCOUNTER — Encounter: Payer: Self-pay | Admitting: Family Medicine

## 2016-09-29 ENCOUNTER — Ambulatory Visit (INDEPENDENT_AMBULATORY_CARE_PROVIDER_SITE_OTHER): Payer: Self-pay | Admitting: Family Medicine

## 2016-09-29 DIAGNOSIS — F900 Attention-deficit hyperactivity disorder, predominantly inattentive type: Secondary | ICD-10-CM

## 2016-09-29 MED ORDER — AMPHETAMINE-DEXTROAMPHETAMINE 10 MG PO TABS: 5.0000 mg | ORAL_TABLET | Freq: Two times a day (BID) | ORAL | 0 refills | Status: DC

## 2016-09-29 NOTE — Progress Notes (Signed)
Subjective:  By signing my name below, I, Laura [name], attest that this documentation has been prepared under the direction and in the presence of Shade Flood, MD Electronically Signed: Charline Bills, ED Scribe 09/29/2016 at 2:59 PM.   Patient ID: Laura [name], female    DOB: Dec 08, 1990, 26 y.o.   MRN: [id number]  Chief Complaint  Patient presents with  . Medication Refill    adderall   HPI Laura [name] is a 26 y.o. female who presents to Primary Care at [facility] for a medication refill of Adderall. H/ ADHD. Last seen by me in December 2017. Continued on Adderall 10 mg bid with option of half dose in afternoon or avoidance of second dose if not needed.   Pt has been taking half in the morning and half in the afternoon. She reports heart palpitations with taking a full dose but none otherwise. Pt denies chest pain, difficulty sleeping, anxiety, depression.   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   Patient Active Problem List   Diagnosis Date Noted  . ADHD (attention deficit hyperactivity disorder) 07/02/2013   Past Medical History:  Diagnosis Date  . ADD (attention deficit disorder)    Past Surgical History:  Procedure Laterality Date  . WISDOM TOOTH EXTRACTION     No Known Allergies Prior to Admission medications   Medication Sig Start Date End Date Taking? Authorizing Provider  amphetamine-dextroamphetamine (ADDERALL) 10 MG tablet Take 1 tablet (10 mg total) by mouth 2 (two) times daily with a meal. 01/30/16  Yes Shade Flood, MD  amphetamine-dextroamphetamine (ADDERALL) 10 MG tablet Take 1 tablet (10 mg total) by mouth 2 (two) times daily with a meal. For 30d from date signed 01/30/16  Yes Shade Flood, MD  amphetamine-dextroamphetamine (ADDERALL) 10 MG tablet Take 1 tablet (10 mg total) by mouth 2 (two) times daily with a meal. For 60 d from signing 01/30/16  Yes Shade Flood, MD  norethindrone-ethinyl estradiol  (MICROGESTIN,JUNEL,LOESTRIN) 1-20 MG-MCG tablet Take 1 tablet by mouth daily.   Yes [provider]   Social History   Social History  . Marital status: Single    Spouse name: N/A  . Number of children: N/A  . Years of education: N/A   Occupational History  . Not on file.   Social History Main Topics  . Smoking status: Never Smoker  . Smokeless tobacco: Never Used  . Alcohol use No  . Drug use: No  . Sexual activity: Yes    Birth control/ protection: Pill     Comment: junel    Other Topics Concern  . Not on file   Social History Narrative  . No narrative on file   Review of Systems  Cardiovascular: Negative for chest pain and palpitations.  Psychiatric/Behavioral: Negative for dysphoric mood and sleep disturbance. The patient is not nervous/anxious.       Objective:   Physical Exam  Constitutional: She is oriented to person, place, and time. She appears well-developed and well-nourished. No distress.  HENT:  Head: Normocephalic and atraumatic.  Eyes: Conjunctivae and EOM are normal.  Neck: Neck supple. No tracheal deviation present.  Cardiovascular: Normal rate and regular rhythm.   Pulmonary/Chest: Effort normal and breath sounds normal. No respiratory distress.  Musculoskeletal: Normal range of motion.  Neurological: She is alert and oriented to person, place, and time.  Skin: Skin is warm and dry.  Psychiatric: She has a normal mood and affect. Her behavior is  normal.  Nursing note and vitals reviewed.  Vitals:   09/29/16 1345  BP: 114/72  Pulse: 79  Resp: 16  Temp: 98.2 F (36.8 C)  TempSrc: Oral  SpO2: 100%  Weight: 121 lb (54.9 kg)  Height: 5\' 2"  (1.575 m)      Assessment & Plan:   Laura [name] is a 26 y.o. female Attention deficit hyperactivity disorder (ADHD), predominantly inattentive type - Plan: amphetamine-dextroamphetamine (ADDERALL) 10 MG tablet, DISCONTINUED: amphetamine-dextroamphetamine (ADDERALL) 10 MG tablet, DISCONTINUED:  amphetamine-dextroamphetamine (ADDERALL) 10 MG tablet Controlled without new side effects, and stable on 5mg  BID.   -3 months Rx given, call for next 3 months, OV in 6 months.   Meds ordered this encounter  Medications  . DISCONTD: amphetamine-dextroamphetamine (ADDERALL) 10 MG tablet    Sig: Take 0.5 tablets (5 mg total) by mouth 2 (two) times daily with a meal.    Dispense:  30 tablet    Refill:  0  . DISCONTD: amphetamine-dextroamphetamine (ADDERALL) 10 MG tablet    Sig: Take 0.5 tablets (5 mg total) by mouth 2 (two) times daily with a meal.    Dispense:  30 tablet    Refill:  0    Fill 30 days from date on Rx.  Marland Kitchen amphetamine-dextroamphetamine (ADDERALL) 10 MG tablet    Sig: Take 0.5 tablets (5 mg total) by mouth 2 (two) times daily with a meal.    Dispense:  30 tablet    Refill:  0    Fill 60 days from date on Rx.   Patient Instructions   Okay to continue same dose of Adderall 5 mg twice a day. 3 months prescriptions were given today, call for next 3 months if that dose is working well, follow-up with me in 6 months for an office visit.  IF you received an x-ray today, you will receive an invoice from [facility]. Please contact [facility] at [phone] with questions or concerns regarding your invoice.   IF you received labwork today, you will receive an invoice from [place]. Please contact [facility] at [phone] with questions or concerns regarding your invoice.   Our billing staff will not be able to assist you with questions regarding bills from these companies.  You will be contacted with the lab results as soon as they are available. The fastest way to get your results is to activate your My Chart account. Instructions are located on the last page of this paperwork. If you have not heard from [place] regarding the results in 2 weeks, please contact this office.       I personally performed the services described in this documentation, which was  scribed in my presence. The recorded information has been reviewed and considered for accuracy and completeness, addended by me as needed, and agree with information above.  Signed,   Meredith Staggers, MD Primary Care at [facility].  09/30/16 10:52 PM

## 2016-09-29 NOTE — Patient Instructions (Addendum)
Okay to continue same dose of Adderall 5 mg twice a day. 3 months prescriptions were given today, call for next 3 months if that dose is working well, follow-up with me in 6 months for an office visit.  IF you received an x-ray today, you will receive an invoice from [facility]. Please contact [facility] at [phone] with questions or concerns regarding your invoice.   IF you received labwork today, you will receive an invoice from [place]. Please contact [facility] at [phone] with questions or concerns regarding your invoice.   Our billing staff will not be able to assist you with questions regarding bills from these companies.  You will be contacted with the lab results as soon as they are available. The fastest way to get your results is to activate your My Chart account. Instructions are located on the last page of this paperwork. If you have not heard from [place] regarding the results in 2 weeks, please contact this office.

## 2017-01-30 ENCOUNTER — Other Ambulatory Visit: Payer: Self-pay

## 2017-01-30 ENCOUNTER — Ambulatory Visit (INDEPENDENT_AMBULATORY_CARE_PROVIDER_SITE_OTHER): Payer: Self-pay | Admitting: Family Medicine

## 2017-01-30 ENCOUNTER — Encounter: Payer: Self-pay | Admitting: Family Medicine

## 2017-01-30 DIAGNOSIS — F900 Attention-deficit hyperactivity disorder, predominantly inattentive type: Secondary | ICD-10-CM

## 2017-01-30 MED ORDER — AMPHETAMINE-DEXTROAMPHETAMINE 10 MG PO TABS: 10.0000 mg | ORAL_TABLET | Freq: Two times a day (BID) | ORAL | 0 refills | Status: AC

## 2017-01-30 MED ORDER — AMPHETAMINE-DEXTROAMPHETAMINE 10 MG PO TABS: 10.0000 mg | ORAL_TABLET | Freq: Two times a day (BID) | ORAL | 0 refills | Status: DC

## 2017-01-30 NOTE — Patient Instructions (Addendum)
Thanks for coming in today. I changed dose to full pill twice per day. Call in 3 months for refills, then repeat office visit in 6 months.    IF you received an x-ray today, you will receive an invoice from [facility]. Please contact [facility] at [phone] with questions or concerns regarding your invoice.   IF you received labwork today, you will receive an invoice from [facility]. Please contact [facility] at [phone] with questions or concerns regarding your invoice.   Our billing staff will not be able to assist you with questions regarding bills from these companies.  You will be contacted with the lab results as soon as they are available. The fastest way to get your results is to activate your My Chart account. Instructions are located on the last page of this paperwork. If you have not heard from us regarding the results in 2 weeks, please contact this office.

## 2017-01-30 NOTE — Progress Notes (Signed)
Subjective:  By signing my name below, I, Laura [name], attest that this documentation has been prepared under the direction and in the presence of Shade FloodJeffrey R Pellegrino Kennard, MD Electronically Signed: Charline BillsEssence [name], ED Scribe 01/30/2017 at 8:20 AM.   Patient ID: Laura [name], female    DOB: [birth year], 26 y.o.   MRN: [id number]  Chief Complaint  Patient presents with  . Medication Refill    adderall    HPI Laura [name] is a 26 y.o. female who presents to [facility] Care at [facility] for a medication refill of Adderall. Last seen 8/20. 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 Takes half a tab in the morning and half a tab in the afternoon of 10 mg tabs. She had noticed side-effects at full dose including palpitations but none with the half dose.   Pt is now doing full tabs in the morning and afternoon since she is studying for her exam and graduates on 5/8. Denies palpitations at this time. Denies weight changes, change in appetite, anxiety, depression. She is eating 3 meals/day, 2 if on rotations. No ho thyroid disease. Pt received an offer with [facility] 1 week ago.  Patient Active Problem List   Diagnosis Date Noted  . ADHD (attention deficit hyperactivity disorder) 07/02/2013   Past Medical History:  Diagnosis Date  . ADD (attention deficit disorder)    Past Surgical History:  Procedure Laterality Date  . WISDOM TOOTH EXTRACTION     No Known Allergies Prior to Admission medications   Medication Sig Start Date End Date Taking? Authorizing Provider  amphetamine-dextroamphetamine (ADDERALL) 10 MG tablet Take 0.5 tablets (5 mg total) by mouth 2 (two) times daily with a meal. 09/29/16  Yes Shade FloodGreene, Amil Moseman R, MD  norethindrone-ethinyl estradiol (MICROGESTIN,JUNEL,LOESTRIN) 1-20 MG-MCG tablet Take 1 tablet by mouth daily.   Yes [provider]   Social History   Socioeconomic History  . Marital status: Single    Spouse name: Not on file  . Number of children: Not on file  . Years of  education: Not on file  . Highest education level: Not on file  Social Needs  . Financial resource strain: Not on file  . Food insecurity - worry: Not on file  . Food insecurity - inability: Not on file  . Transportation needs - medical: Not on file  . Transportation needs - non-medical: Not on file  Occupational History  . Not on file  Tobacco Use  . Smoking status: Never Smoker  . Smokeless tobacco: Never Used  Substance and Sexual Activity  . Alcohol use: No  . Drug use: No  . Sexual activity: Yes    Birth control/protection: Pill    Comment: junel   Other Topics Concern  . Not on file  Social History Narrative  . Not on file   Review of Systems  Constitutional: Negative for appetite change and unexpected weight change.  Cardiovascular: Negative for palpitations.  Psychiatric/Behavioral: Negative for dysphoric mood. The patient is not nervous/anxious.       Objective:   Physical Exam  Constitutional: She is oriented to person, place, and time. She appears well-developed and well-nourished.  HENT:  Head: Normocephalic and atraumatic.  Eyes: Conjunctivae and EOM are normal. Pupils are equal, round, and reactive to light.  Neck: Carotid bruit is not present.  Cardiovascular: Normal rate, regular rhythm, normal heart sounds and intact distal pulses.  Pulmonary/Chest: Effort normal and breath sounds normal.  Abdominal: Soft. She exhibits no pulsatile midline mass. There is no  tenderness.  Neurological: She is alert and oriented to person, place, and time.  Skin: Skin is warm and dry.  Psychiatric: She has a normal mood and affect. Her behavior is normal.  Vitals reviewed.  Vitals:   01/30/17 0759  BP: 102/70  Pulse: 85  Resp: 18  Temp: 98.7 F (37.1 C)  TempSrc: Oral  SpO2: 99%  Weight: 122 lb 3.2 oz (55.4 kg)  Height: 5\' 2"  (1.575 m)   Wt Readings from Last 3 Encounters:  01/30/17 122 lb 3.2 oz (55.4 kg)  09/29/16 121 lb (54.9 kg)  01/30/16 122 lb (55.3 kg)        Assessment & Plan:   Laura [name] is a 26 y.o. female Attention deficit hyperactivity disorder (ADHD), predominantly inattentive type - Plan: amphetamine-dextroamphetamine (ADDERALL) 10 MG tablet, DISCONTINUED: amphetamine-dextroamphetamine (ADDERALL) 10 MG tablet, DISCONTINUED: amphetamine-dextroamphetamine (ADDERALL) 10 MG tablet Controlled at 10mg  BID dose, and denies new side effects, including palpitations. 3 months rx provided, call for next 3 mo, then OV in 6 months.   Meds ordered this encounter  Medications  . DISCONTD: amphetamine-dextroamphetamine (ADDERALL) 10 MG tablet    Sig: Take 1 tablet (10 mg total) by mouth 2 (two) times daily with a meal.    Dispense:  60 tablet    Refill:  0    Fill 60 days from date on Rx.  Marland Kitchen. DISCONTD: amphetamine-dextroamphetamine (ADDERALL) 10 MG tablet    Sig: Take 1 tablet (10 mg total) by mouth 2 (two) times daily with a meal.    Dispense:  60 tablet    Refill:  0    Fill 30 days from date on Rx.  Marland Kitchen. amphetamine-dextroamphetamine (ADDERALL) 10 MG tablet    Sig: Take 1 tablet (10 mg total) by mouth 2 (two) times daily with a meal.    Dispense:  60 tablet    Refill:  0   Patient Instructions   Thanks for coming in today. I changed dose to full pill twice per day. Call in 3 months for refills, then repeat office visit in 6 months.    IF you received an x-ray today, you will receive an invoice from [facility]. Please contact [facility] at [phone] with questions or concerns regarding your invoice.   IF you received labwork today, you will receive an invoice from [facility]. Please contact [facility] at [phone] with questions or concerns regarding your invoice.   Our billing staff will not be able to assist you with questions regarding bills from these companies.  You will be contacted with the lab results as soon as they are available. The fastest way to get your results is to activate your My Chart account.  Instructions are located on the last page of this paperwork. If you have not heard from us regarding the results in 2 weeks, please contact this office.      I personally performed the services described in this documentation, which was scribed in my presence. The recorded information has been reviewed and considered for accuracy and completeness, addended by me as needed, and agree with information above.  Signed,   Meredith StaggersJeffrey Arben Packman, MD Primary Care at [facility].  01/30/17 8:26 AM

## 2017-10-31 ENCOUNTER — Observation Stay (HOSPITAL_COMMUNITY)
Admission: EM | Admit: 2017-10-31 | Discharge: 2017-11-01 | Disposition: A | Discharge status: Home / Self Care | Payer: Self-pay | Attending: General Surgery | Admitting: General Surgery

## 2017-10-31 ENCOUNTER — Emergency Department (HOSPITAL_COMMUNITY): Payer: Self-pay | Admitting: Certified Registered"

## 2017-10-31 ENCOUNTER — Encounter (HOSPITAL_COMMUNITY): Payer: Self-pay | Admitting: Emergency Medicine

## 2017-10-31 ENCOUNTER — Encounter (HOSPITAL_COMMUNITY)
Admission: EM | Disposition: A | Discharge status: Home / Self Care | Payer: Self-pay | Source: Home / Self Care | Attending: Emergency Medicine

## 2017-10-31 ENCOUNTER — Other Ambulatory Visit: Payer: Self-pay

## 2017-10-31 ENCOUNTER — Emergency Department (HOSPITAL_COMMUNITY): Payer: Self-pay

## 2017-10-31 DIAGNOSIS — K3533 Acute appendicitis with perforation and localized peritonitis, with abscess: Principal | ICD-10-CM | POA: Insufficient documentation

## 2017-10-31 DIAGNOSIS — K3580 Unspecified acute appendicitis: Secondary | ICD-10-CM | POA: Diagnosis present

## 2017-10-31 HISTORY — PX: LAPAROSCOPIC APPENDECTOMY: SHX408

## 2017-10-31 LAB — CBC
HCT: 42.4 % (ref 36.0–46.0)
HEMOGLOBIN: 13.4 g/dL (ref 12.0–15.0)
MCH: 32.9 pg (ref 26.0–34.0)
MCHC: 31.6 g/dL (ref 30.0–36.0)
MCV: 104.2 fL — AB (ref 78.0–100.0)
PLATELETS: 218 10*3/uL (ref 150–400)
RBC: 4.07 MIL/uL (ref 3.87–5.11)
RDW: 11.7 % (ref 11.5–15.5)
WBC: 16.3 10*3/uL — ABNORMAL HIGH (ref 4.0–10.5)

## 2017-10-31 LAB — I-STAT BETA HCG BLOOD, ED (MC, WL, AP ONLY): I-stat hCG, quantitative: <5 m[IU]/mL (ref <5)

## 2017-10-31 LAB — COMPREHENSIVE METABOLIC PANEL
ALBUMIN: 3.8 g/dL (ref 3.5–5.0)
ALK PHOS: 41 U/L (ref 38–126)
ALT: 12 U/L (ref 0–44)
AST: 17 U/L (ref 15–41)
Anion gap: 12 (ref 5–15)
BUN: 13 mg/dL (ref 6–20)
CHLORIDE: 100 mmol/L (ref 98–111)
CO2: 25 mmol/L (ref 22–32)
CREATININE: 0.93 mg/dL (ref 0.44–1.00)
Calcium: 9.2 mg/dL (ref 8.9–10.3)
GFR calc Af Amer: >60 mL/min (ref >60)
GFR calc non Af Amer: >60 mL/min (ref >60)
GLUCOSE: 117 mg/dL — AB (ref 70–99)
Potassium: 3.8 mmol/L (ref 3.5–5.1)
SODIUM: 137 mmol/L (ref 135–145)
Total Bilirubin: 0.7 mg/dL (ref 0.3–1.2)
Total Protein: 7.2 g/dL (ref 6.5–8.1)

## 2017-10-31 LAB — URINALYSIS, ROUTINE W REFLEX MICROSCOPIC
Bacteria, UA: NONE SEEN
Bilirubin Urine: NEGATIVE
Glucose, UA: NEGATIVE mg/dL
KETONES UR: 20 mg/dL — AB
Leukocytes, UA: NEGATIVE
Nitrite: NEGATIVE
PROTEIN: NEGATIVE mg/dL
Specific Gravity, Urine: 1.027 (ref 1.005–1.030)
pH: 6 (ref 5.0–8.0)

## 2017-10-31 LAB — WET PREP, GENITAL
Sperm: NONE SEEN
Trich, Wet Prep: NONE SEEN
Yeast Wet Prep HPF POC: NONE SEEN

## 2017-10-31 LAB — LIPASE, BLOOD: LIPASE: 24 U/L (ref 11–51)

## 2017-10-31 SURGERY — APPENDECTOMY, LAPAROSCOPIC
Anesthesia: General | Site: Abdomen

## 2017-10-31 MED ORDER — PHENYLEPHRINE 40 MCG/ML (10ML) SYRINGE FOR IV PUSH (FOR BLOOD PRESSURE SUPPORT)
PREFILLED_SYRINGE | INTRAVENOUS | Status: AC
  Filled 2017-10-31: qty 10

## 2017-10-31 MED ORDER — ONDANSETRON HCL 4 MG/2ML IJ SOLN
4.0000 mg | Freq: Once | INTRAMUSCULAR | Status: AC
  Administered 2017-10-31: 4 mg via INTRAVENOUS
  Filled 2017-10-31: qty 2

## 2017-10-31 MED ORDER — LIDOCAINE 2% (20 MG/ML) 5 ML SYRINGE
INTRAMUSCULAR | Status: AC
  Filled 2017-10-31: qty 5

## 2017-10-31 MED ORDER — DEXTROSE-NACL 5-0.45 % IV SOLN
INTRAVENOUS | Status: DC
  Administered 2017-10-31: 21:00:00 via INTRAVENOUS

## 2017-10-31 MED ORDER — PHENYLEPHRINE HCL 10 MG/ML IJ SOLN
INTRAMUSCULAR | Status: DC | PRN
  Administered 2017-10-31: 80 ug via INTRAVENOUS

## 2017-10-31 MED ORDER — ONDANSETRON HCL 4 MG/2ML IJ SOLN
INTRAMUSCULAR | Status: DC | PRN
  Administered 2017-10-31: 4 mg via INTRAVENOUS

## 2017-10-31 MED ORDER — LACTATED RINGERS IV SOLN
INTRAVENOUS | Status: DC | PRN
  Administered 2017-10-31: 20:00:00 via INTRAVENOUS

## 2017-10-31 MED ORDER — ONDANSETRON HCL 4 MG/2ML IJ SOLN
INTRAMUSCULAR | Status: AC
  Filled 2017-10-31: qty 2

## 2017-10-31 MED ORDER — HYDROCODONE-ACETAMINOPHEN 5-325 MG PO TABS
1.0000 | ORAL_TABLET | ORAL | Status: DC | PRN
  Administered 2017-10-31: 1 via ORAL
  Filled 2017-10-31: qty 2

## 2017-10-31 MED ORDER — MORPHINE SULFATE (PF) 4 MG/ML IV SOLN
4.0000 mg | Freq: Once | INTRAVENOUS | Status: AC
  Administered 2017-10-31: 4 mg via INTRAVENOUS
  Filled 2017-10-31: qty 1

## 2017-10-31 MED ORDER — BUPIVACAINE-EPINEPHRINE 0.25% -1:200000 IJ SOLN
INTRAMUSCULAR | Status: DC | PRN
  Administered 2017-10-31: 10 mL

## 2017-10-31 MED ORDER — HYDRALAZINE HCL 20 MG/ML IJ SOLN: 10.0000 mg | INTRAMUSCULAR | Status: DC | PRN

## 2017-10-31 MED ORDER — ROCURONIUM BROMIDE 50 MG/5ML IV SOSY
PREFILLED_SYRINGE | INTRAVENOUS | Status: DC | PRN
  Administered 2017-10-31: 30 mg via INTRAVENOUS

## 2017-10-31 MED ORDER — SODIUM CHLORIDE 0.9 % IV SOLN
2.0000 g | Freq: Once | INTRAVENOUS | Status: AC
  Administered 2017-10-31: 2 g via INTRAVENOUS
  Filled 2017-10-31: qty 20

## 2017-10-31 MED ORDER — ONDANSETRON 4 MG PO TBDP: 4.0000 mg | ORAL_TABLET | Freq: Four times a day (QID) | ORAL | Status: DC | PRN

## 2017-10-31 MED ORDER — LIDOCAINE 2% (20 MG/ML) 5 ML SYRINGE
INTRAMUSCULAR | Status: DC | PRN
  Administered 2017-10-31: 80 mg via INTRAVENOUS

## 2017-10-31 MED ORDER — SODIUM CHLORIDE 0.9 % IV BOLUS
1000.0000 mL | Freq: Once | INTRAVENOUS | Status: AC
  Administered 2017-10-31: 1000 mL via INTRAVENOUS

## 2017-10-31 MED ORDER — BUPIVACAINE-EPINEPHRINE (PF) 0.25% -1:200000 IJ SOLN
INTRAMUSCULAR | Status: AC
  Filled 2017-10-31: qty 30

## 2017-10-31 MED ORDER — DEXAMETHASONE SODIUM PHOSPHATE 10 MG/ML IJ SOLN
INTRAMUSCULAR | Status: AC
  Filled 2017-10-31: qty 1

## 2017-10-31 MED ORDER — DEXAMETHASONE SODIUM PHOSPHATE 10 MG/ML IJ SOLN
INTRAMUSCULAR | Status: DC | PRN
  Administered 2017-10-31: 10 mg via INTRAVENOUS

## 2017-10-31 MED ORDER — FENTANYL CITRATE (PF) 100 MCG/2ML IJ SOLN
INTRAMUSCULAR | Status: DC | PRN
  Administered 2017-10-31 (×3): 50 ug via INTRAVENOUS
  Administered 2017-10-31: 100 ug via INTRAVENOUS

## 2017-10-31 MED ORDER — PROPOFOL 10 MG/ML IV BOLUS
INTRAVENOUS | Status: AC
  Filled 2017-10-31: qty 20

## 2017-10-31 MED ORDER — MIDAZOLAM HCL 5 MG/5ML IJ SOLN
INTRAMUSCULAR | Status: DC | PRN
  Administered 2017-10-31: 2 mg via INTRAVENOUS

## 2017-10-31 MED ORDER — IOHEXOL 300 MG/ML  SOLN
80.0000 mL | Freq: Once | INTRAMUSCULAR | Status: AC | PRN
  Administered 2017-10-31: 80 mL via INTRAVENOUS

## 2017-10-31 MED ORDER — SUGAMMADEX SODIUM 200 MG/2ML IV SOLN
INTRAVENOUS | Status: DC | PRN
  Administered 2017-10-31: 200 mg via INTRAVENOUS

## 2017-10-31 MED ORDER — HYDROMORPHONE HCL 1 MG/ML IJ SOLN: 0.2500 mg | INTRAMUSCULAR | Status: DC | PRN

## 2017-10-31 MED ORDER — ENOXAPARIN SODIUM 40 MG/0.4ML ~~LOC~~ SOLN
40.0000 mg | SUBCUTANEOUS | Status: DC
  Administered 2017-11-01: 40 mg via SUBCUTANEOUS
  Filled 2017-10-31: qty 0.4

## 2017-10-31 MED ORDER — SUCCINYLCHOLINE CHLORIDE 20 MG/ML IJ SOLN
INTRAMUSCULAR | Status: DC | PRN
  Administered 2017-10-31: 100 mg via INTRAVENOUS

## 2017-10-31 MED ORDER — FENTANYL CITRATE (PF) 250 MCG/5ML IJ SOLN
INTRAMUSCULAR | Status: AC
  Filled 2017-10-31: qty 5

## 2017-10-31 MED ORDER — ONDANSETRON HCL 4 MG/2ML IJ SOLN: 4.0000 mg | Freq: Four times a day (QID) | INTRAMUSCULAR | Status: DC | PRN

## 2017-10-31 MED ORDER — SODIUM CHLORIDE 0.9 % IR SOLN
Status: DC | PRN
  Administered 2017-10-31: 1000 mL

## 2017-10-31 MED ORDER — PROPOFOL 10 MG/ML IV BOLUS
INTRAVENOUS | Status: DC | PRN
  Administered 2017-10-31: 150 mg via INTRAVENOUS

## 2017-10-31 MED ORDER — MIDAZOLAM HCL 2 MG/2ML IJ SOLN
INTRAMUSCULAR | Status: AC
  Filled 2017-10-31: qty 2

## 2017-10-31 MED ORDER — MORPHINE SULFATE (PF) 2 MG/ML IV SOLN: 2.0000 mg | INTRAVENOUS | Status: DC | PRN

## 2017-10-31 MED ORDER — DIPHENHYDRAMINE HCL 25 MG PO CAPS: 25.0000 mg | ORAL_CAPSULE | Freq: Four times a day (QID) | ORAL | Status: DC | PRN

## 2017-10-31 MED ORDER — DIPHENHYDRAMINE HCL 50 MG/ML IJ SOLN: 25.0000 mg | Freq: Four times a day (QID) | INTRAMUSCULAR | Status: DC | PRN

## 2017-10-31 MED ORDER — METRONIDAZOLE IN NACL 5-0.79 MG/ML-% IV SOLN
500.0000 mg | Freq: Once | INTRAVENOUS | Status: AC
  Administered 2017-10-31: 500 mg via INTRAVENOUS
  Filled 2017-10-31: qty 100

## 2017-10-31 MED ORDER — SUCCINYLCHOLINE CHLORIDE 200 MG/10ML IV SOSY
PREFILLED_SYRINGE | INTRAVENOUS | Status: AC
  Filled 2017-10-31: qty 10

## 2017-10-31 SURGICAL SUPPLY — 37 items
APPLIER CLIP 5 13 M/L LIGAMAX5 (MISCELLANEOUS)
CANISTER SUCT 3000ML PPV (MISCELLANEOUS) ×3 IMPLANT
CHLORAPREP W/TINT 26ML (MISCELLANEOUS) ×3 IMPLANT
CLIP APPLIE 5 13 M/L LIGAMAX5 (MISCELLANEOUS) IMPLANT
CLIP VESOLOCK XL 6/CT (CLIP) ×3 IMPLANT
COVER SURGICAL LIGHT HANDLE (MISCELLANEOUS) ×3 IMPLANT
DERMABOND ADVANCED (GAUZE/BANDAGES/DRESSINGS) ×2
DERMABOND ADVANCED .7 DNX12 (GAUZE/BANDAGES/DRESSINGS) ×1 IMPLANT
ELECT REM PT RETURN 9FT ADLT (ELECTROSURGICAL) ×3
ELECTRODE REM PT RTRN 9FT ADLT (ELECTROSURGICAL) ×1 IMPLANT
ENDOLOOP SUT PDS II  0 18 (SUTURE)
ENDOLOOP SUT PDS II 0 18 (SUTURE) IMPLANT
GLOVE BIOGEL PI IND STRL 7.0 (GLOVE) ×1 IMPLANT
GLOVE BIOGEL PI INDICATOR 7.0 (GLOVE) ×2
GLOVE SURG SS PI 7.0 STRL IVOR (GLOVE) ×3 IMPLANT
GOWN STRL REUS W/ TWL LRG LVL3 (GOWN DISPOSABLE) ×3 IMPLANT
GOWN STRL REUS W/TWL LRG LVL3 (GOWN DISPOSABLE) ×6
GRASPER SUT TROCAR 14GX15 (MISCELLANEOUS) ×3 IMPLANT
KIT BASIN OR (CUSTOM PROCEDURE TRAY) ×3 IMPLANT
KIT TURNOVER KIT B (KITS) ×3 IMPLANT
NEEDLE 22X1 1/2 (OR ONLY) (NEEDLE) ×3 IMPLANT
NS IRRIG 1000ML POUR BTL (IV SOLUTION) ×3 IMPLANT
PAD ARMBOARD 7.5X6 YLW CONV (MISCELLANEOUS) ×6 IMPLANT
POUCH RETRIEVAL ECOSAC 10 (ENDOMECHANICALS) ×1 IMPLANT
POUCH RETRIEVAL ECOSAC 10MM (ENDOMECHANICALS) ×2
SCISSORS LAP 5X35 DISP (ENDOMECHANICALS) ×3 IMPLANT
SET IRRIG TUBING LAPAROSCOPIC (IRRIGATION / IRRIGATOR) ×3 IMPLANT
SPECIMEN JAR SMALL (MISCELLANEOUS) ×9 IMPLANT
SUT MNCRL AB 4-0 PS2 18 (SUTURE) ×3 IMPLANT
TOWEL OR 17X24 6PK STRL BLUE (TOWEL DISPOSABLE) ×3 IMPLANT
TOWEL OR 17X26 10 PK STRL BLUE (TOWEL DISPOSABLE) ×3 IMPLANT
TRAY FOLEY CATH 14FR (SET/KITS/TRAYS/PACK) ×3 IMPLANT
TRAY LAPAROSCOPIC MC (CUSTOM PROCEDURE TRAY) ×3 IMPLANT
TROCAR XCEL NON-BLD 11X100MML (ENDOMECHANICALS) ×3 IMPLANT
TROCAR XCEL NON-BLD 5MMX100MML (ENDOMECHANICALS) ×6 IMPLANT
TUBING INSUFFLATION (TUBING) ×3 IMPLANT
WATER STERILE IRR 1000ML POUR (IV SOLUTION) ×3 IMPLANT

## 2017-10-31 NOTE — ED Notes (Signed)
Morphine 4mg  given  Unable to get it scanned

## 2017-10-31 NOTE — Op Note (Signed)
Preoperative diagnosis: acute appendicitis with peritonitis  Postoperative diagnosis: Same   Procedure: laparoscopic appendectomy  Surgeon: Feliciana RossettiLuke Gyasi Hazzard, M.D.  Asst: none  Anesthesia: Gen.   Indications for procedure: Laura SladeRaven Ausley is a 27 y.o. female with symptoms of pain with heel strike, obstipation and pain in right lower quadrant consistent with acute appendicitis. Confirmed by CT scan and laboratory values.  Description of procedure: The patient was brought into the operative suite, placed supine. Anesthesia was administered with endotracheal tube. The patient's left arm was tucked. All pressure points were offloaded by foam padding. The patient was prepped and draped in the usual sterile fashion.  A transverse incision was made to the left of the umbilicus and a 5mm trocar was us. Pneumoperitoneum was applied with high flow low pressure.  2 5mm trocars were placed, one in the suprapubic space, one in the LLQ, the periumbilical incision was then up-sized and a 11mm trocar placed in that space. All trocars sites were first anesthesized with Marcaine. Next the patient was placed in trendelenberg, rotated to the left. The omentum was retracted cephalad. The cecum and appendix were identified. The appendix was gangrenous in appearance and adhesed to the right fallopian tube. The base of the appendix was dissected and a window through the mesoappendix was created with blunt dissection. Large Hem-o-lock clips were used to doubly ligate the base of the appendix and mesoappendix. The appendix was cut free with scissors.  The appendix was placed in a specimen bag. The pelvis and RLQ were irrigated. The 2 ovaries appeared normal. There was some murky fluid in the pelvis. The appendix was removed via the umbilicus. 0 vicryl was used to close the fascial defect. Pneumoperitoneum was removed, all trocars were removed. All incisions were closed with 4-0 monocryl subcuticular stitch. The patient woke from  anesthesia and was brought to PACU in stable condition.  Findings: gangrenous appendicitis  Specimen: appendix  Blood loss: 30 ml  Local anesthesia: 30 ml marcaine  Complications: none  Images:       Feliciana RossettiLuke Avangeline Stockburger, M.D. General, Bariatric, & Minimally Invasive Surgery [facility]

## 2017-10-31 NOTE — ED Notes (Signed)
General surgeon at  The bedside

## 2017-10-31 NOTE — ED Triage Notes (Signed)
Pt states RLQ abdominal pain since Thursday, tender to palpation and tender with movement. Pt has had nausea and vomiting, but none since last night. 3 bowel movements today but no diarrhea. LMP this month.

## 2017-10-31 NOTE — Anesthesia Postprocedure Evaluation (Signed)
Anesthesia Post Note  Patient: Laura SladeRaven [name]  Procedure(s) Performed: APPENDECTOMY LAPAROSCOPIC (N/A Abdomen)     Patient location during evaluation: PACU Anesthesia Type: General Level of consciousness: awake Pain management: pain level controlled Vital Signs Assessment: post-procedure vital signs reviewed and stable Respiratory status: spontaneous breathing Cardiovascular status: stable Postop Assessment: no apparent nausea or vomiting Anesthetic complications: no    Last Vitals:  Vitals:   10/31/17 2130 10/31/17 2132  BP: 110/77 110/77  Pulse: 77 84  Resp: 16 16  Temp: 37.1 C 37.1 C  SpO2: 100% 100%    Last Pain:  Vitals:   10/31/17 2132  TempSrc: Oral  PainSc:                  Tonji Elliff

## 2017-10-31 NOTE — ED Provider Notes (Signed)
[facility] EMERGENCY DEPARTMENT Provider Note   CSN: [id number] Arrival date & time: 10/31/17  1226     History   Chief Complaint Chief Complaint  Patient presents with  . Abdominal Pain    HPI Laura [name] is a 27 y.o. female without significant past medical history who presents to the emergency department at the request of urgent care for evaluation of right lower quadrant abdominal pain for the past 3 days.  Patient states that she ate, nodules that were sitting out for an extended period of time in a restaurant Thursday evening, she subsequently developed nausea with multiple episodes of nonbloody emesis.  After onset of nausea and vomiting she developed lower abdominal pain, RLQ/LLQ.  She states that the abdominal discomfort has been constant, 7 out of 10 in severity, worse with certain positions/movements as well as with going over bumps on the car ride here.  Alleviated she can get herself into a comfortable position at times.  She states that the nausea and vomiting resolved yesterday, she has not had any of this today, he has had overall poor appetite.  She reports 3 episodes of loose stools, not necessarily diarrhea.  Denies fever, chills, melena, hematochezia, dysuria, vaginal bleeding, or vaginal discharge.  She is sexually active in a monogamous relationship with her fiance, OCP use, no condoms, not concerned for STDs. Last PO intake was gingerale around 10AM, no food intake.   HPI  Past Medical History:  Diagnosis Date  . ADD (attention deficit disorder)     Patient Active Problem List   Diagnosis Date Noted  . ADHD (attention deficit hyperactivity disorder) 07/02/2013    Past Surgical History:  Procedure Laterality Date  . WISDOM TOOTH EXTRACTION       OB History    Gravida  0   Para  0   Term  0   Preterm  0   AB  0   Living  0     SAB  0   TAB  0   Ectopic  0   Multiple  0   Live Births               Home  Medications    Prior to Admission medications   Medication Sig Start Date End Date Taking? Authorizing Provider  amphetamine-dextroamphetamine (ADDERALL) 10 MG tablet Take 1 tablet (10 mg total) by mouth 2 (two) times daily with a meal. 01/30/17   Shade Flood, MD  norethindrone-ethinyl estradiol (MICROGESTIN,JUNEL,LOESTRIN) 1-20 MG-MCG tablet Take 1 tablet by mouth daily.    [provider]    Family History Family History  Problem Relation Age of Onset  . Diabetes Maternal Grandmother     Social History Social History   Tobacco Use  . Smoking status: Never Smoker  . Smokeless tobacco: Never Used  Substance Use Topics  . Alcohol use: No  . Drug use: No     Allergies   Patient has no known allergies.   Review of Systems Review of Systems  Constitutional: Positive for appetite change. Negative for chills and fever.  Respiratory: Negative for shortness of breath.   Cardiovascular: Negative for chest pain.  Gastrointestinal: Positive for abdominal pain, diarrhea ("loose stool"), nausea and vomiting. Negative for anal bleeding, blood in stool and constipation.  Genitourinary: Negative for dysuria, flank pain, hematuria, vaginal bleeding and vaginal discharge.  All other systems reviewed and are negative.    Physical Exam Updated Vital Signs BP 113/82   Pulse  75   Temp 98.6 F (37 C)   Resp 18   LMP 10/12/2017   SpO2 100%   Physical Exam  Constitutional: She appears well-developed and well-nourished.  Non-toxic appearance. No distress.  HENT:  Head: Normocephalic and atraumatic.  Eyes: Conjunctivae are normal. Right eye exhibits no discharge. Left eye exhibits no discharge.  Neck: Neck supple.  Cardiovascular: Normal rate and regular rhythm.  Pulmonary/Chest: Effort normal and breath sounds normal. No respiratory distress. She has no wheezes. She has no rhonchi. She has no rales.  Respiration even and unlabored  Abdominal: Soft. She exhibits no  distension. There is tenderness in the right lower quadrant and suprapubic area. There is tenderness at McBurney's point. There is no rigidity, no rebound, no guarding, no CVA tenderness and negative Murphy's sign.  Negative rovsings, obturator, and psoas.   Genitourinary: Pelvic exam was performed with patient supine. There is no tenderness or lesion on the right labia. There is no tenderness or lesion on the left labia. Cervix exhibits no friability. Right adnexum displays no mass and no fullness. Left adnexum displays no mass and no fullness. No bleeding in the vagina. Vaginal discharge (mild amount clear to white) found.  Genitourinary Comments: Patient diffusely uncomfortable throughout pelvic exam, no specific areas of increased tenderness. EDT present as chaperone.   Neurological: She is alert.  Clear speech.   Skin: Skin is warm and dry. No rash noted.  Psychiatric: She has a normal mood and affect. Her behavior is normal.  Nursing note and vitals reviewed.   ED Treatments / Results  Labs (all labs ordered are listed, but only abnormal results are displayed) Labs Reviewed  WET PREP, GENITAL - Abnormal; Notable for the following components:      Result Value   Clue Cells Wet Prep HPF POC PRESENT (*)    WBC, Wet Prep HPF POC MANY (*)    All other components within normal limits  COMPREHENSIVE METABOLIC PANEL - Abnormal; Notable for the following components:   Glucose, Bld 117 (*)    All other components within normal limits  CBC - Abnormal; Notable for the following components:   WBC 16.3 (*)    MCV 104.2 (*)    All other components within normal limits  URINALYSIS, ROUTINE W REFLEX MICROSCOPIC - Abnormal; Notable for the following components:   Hgb urine dipstick SMALL (*)    Ketones, ur 20 (*)    All other components within normal limits  LIPASE, BLOOD  I-STAT BETA HCG BLOOD, ED ([facility], [facility], [facility] ONLY)  GC/CHLAMYDIA PROBE AMP ([place]) NOT AT [facility]     EKG None  Radiology Ct Abdomen Pelvis W Contrast  Result Date: 10/31/2017 CLINICAL DATA:  Right lower quadrant abdominal pain, nausea and vomiting the past 2 days. EXAM: CT ABDOMEN AND PELVIS WITH CONTRAST TECHNIQUE: Multidetector CT imaging of the abdomen and pelvis was performed using the standard protocol following bolus administration of intravenous contrast. CONTRAST:  80mL OMNIPAQUE IOHEXOL 300 MG/ML  SOLN COMPARISON:  None. FINDINGS: Lower chest: Clear lung bases. Hepatobiliary: No focal liver abnormality is seen. No gallstones, gallbladder wall thickening, or biliary dilatation. Pancreas: Unremarkable. No pancreatic ductal dilatation or surrounding inflammatory changes. Spleen: Normal in size without focal abnormality. Adrenals/Urinary Tract: Adrenal glands are unremarkable. Kidneys are normal, without renal calculi, focal lesion, or hydronephrosis. Bladder is unremarkable. Stomach/Bowel: The appendix is difficult to separate from unopacified distal small bowel loops due to the lack of oral contrast. There appear to be 2 adjacent  4 mm appendicoliths in the proximal appendix. The appendix distal to that location is dilated and filled with fluid and some gas, measuring 10 mm in maximum diameter. The appendix is located in the right pelvis. No periappendiceal fluid collections are seen. The stomach, small bowel and colon are unremarkable. Vascular/Lymphatic: No significant vascular findings are present. No enlarged abdominal or pelvic lymph nodes. Reproductive: Uterus and bilateral adnexa are unremarkable. Other: Small to moderate amount of free peritoneal fluid in the pelvic cul-de-sac. Musculoskeletal: Unremarkable bones. IMPRESSION: Findings compatible with acute appendicitis without abscess. The examination is somewhat limited by the lack of oral contrast. Critical Value/emergent results were called by telephone at the time of interpretation on 10/31/2017 at 6:03 pm to [facility], PA-C ,  who verbally acknowledged these results. Electronically Signed   By: Beckie Salts M.D.   On: 10/31/2017 18:08    Procedures Procedures (including critical care time)  Medications Ordered in ED Medications  morphine 4 MG/ML injection 4 mg (has no administration in time range)  cefTRIAXone (ROCEPHIN) 2 g in sodium chloride 0.9 % 100 mL IVPB (has no administration in time range)    And  metroNIDAZOLE (FLAGYL) IVPB 500 mg (has no administration in time range)  sodium chloride 0.9 % bolus 1,000 mL (1,000 mLs Intravenous New Bag/Given 10/31/17 1658)  ondansetron (ZOFRAN) injection 4 mg (4 mg Intravenous Given 10/31/17 1659)  iohexol (OMNIPAQUE) 300 MG/ML solution 80 mL (80 mLs Intravenous Contrast Given 10/31/17 1740)    Initial Impression / Assessment and Plan / ED Course  I have reviewed the triage vital signs and the nursing notes.  Pertinent labs & imaging results that were available during my care of the patient were reviewed by me and considered in my medical decision making (see chart for details).  Patient presents to the ED with complaints of abdominal pain. Patient nontoxic appearing, in no apparent distress, vitals WNL. On exam patient tender to RLQ and suprapubic areas of the abdomen, no peritoneal signs. Pelvic exam subsequently performed, patient diffusely uncomfortable without focal tenderness. Labs reviewed notable for leukocytosis at 16.3. No anemia. No significant electrolyte derangements. LFTs, renal function, and lipase WNL. Urinalysis without obvious infection, mild hgb and ketones. Pregnancy test negative, therefore doubt ectopic pregnancy. DDX: appendicitis, ovarian cyst, ovarian torsion, cholecystitis, symptomatic cholelithiasis, viral GI illness, PID, feel most pertinent and most likely to be appendicitis, Will evaluate with CT abdomen/pelvis. Analgesics, anti-emetics, and fluids administered.   18:05: CONSULT: Discussed case with radiologist Dr. Renato Gails- it appears patient has  findings consistent with appendicitis without perforation/abscess. Abx ordered, consult to generally surgery placed.   18:06: RE-EVAL: Patient resting comfortably, states that pain is improved, we discussed results and plan, provided opportunity for questions, she and her family confirmed understanding.   18:09: CONSULT: Discussed with general surgeon Dr. Sheliah Hatch- will come to the ER to evaluate the patient.   Patient taken to OR for surgery.   Clinical Course as of Nov 01 2127  Sat Oct 31, 2017  6046 27 year old female with nausea vomiting and abdominal pain.  She is got a moderately elevated white blood cell count and is pregnancy negative.  She has tenderness in her right lower quadrant.  By CT he has positive appendicitis so will call surgery for evaluation.   [MB]    Clinical Course User Index [MB] Terrilee Files, MD     Final Clinical Impressions(s) / ED Diagnoses   Final diagnoses:  Acute appendicitis, unspecified acute appendicitis type  ED Discharge Orders    None       Desmond Lope 10/31/17 2132    Terrilee Files, MD 11/01/17 [phone]

## 2017-10-31 NOTE — ED Notes (Signed)
Op permit signed to or now

## 2017-10-31 NOTE — Transfer of Care (Signed)
Immediate Anesthesia Transfer of Care Note  Patient: Laura [name]  Procedure(s) Performed: APPENDECTOMY LAPAROSCOPIC (N/A Abdomen)  Patient Location: PACU  Anesthesia Type:General  Level of Consciousness: awake, alert  and oriented  Airway & Oxygen Therapy: Patient Spontanous Breathing  Post-op Assessment: Report given to RN and Post -op Vital signs reviewed and stable  Post vital signs: Reviewed and stable  Last Vitals:  Vitals Value Taken Time  BP    Temp    Pulse    Resp 18 10/31/2017  8:52 PM  SpO2    Vitals shown include unvalidated device data.  Last Pain:  Vitals:   10/31/17 1832  PainSc: 7          Complications: No apparent anesthesia complications

## 2017-10-31 NOTE — Anesthesia Preprocedure Evaluation (Signed)
Anesthesia Evaluation  Patient identified by MRN, date of birth, ID band Patient awake    Reviewed: Allergy & Precautions, NPO status , Patient's Chart, lab work & pertinent test results  Airway Mallampati: II  TM Distance: >3 FB     Dental   Pulmonary neg pulmonary ROS,    breath sounds clear to auscultation       Cardiovascular negative cardio ROS   Rhythm:Regular Rate:Normal     Neuro/Psych    GI/Hepatic Neg liver ROS, GI history noted. CG   Endo/Other  negative endocrine ROS  Renal/GU negative Renal ROS     Musculoskeletal   Abdominal   Peds  Hematology   Anesthesia Other Findings   Reproductive/Obstetrics                             Anesthesia Physical Anesthesia Plan  ASA: II and emergent  Anesthesia Plan: General   Post-op Pain Management:    Induction: Intravenous, Rapid sequence and Cricoid pressure planned  PONV Risk Score and Plan: Ondansetron, Dexamethasone and Midazolam  Airway Management Planned: Oral ETT  Additional Equipment:   Intra-op Plan:   Post-operative Plan: Extubation in OR  Informed Consent: I have reviewed the patients History and Physical, chart, labs and discussed the procedure including the risks, benefits and alternatives for the proposed anesthesia with the patient or authorized representative who has indicated his/her understanding and acceptance.   Dental advisory given  Plan Discussed with: CRNA and Anesthesiologist  Anesthesia Plan Comments:         Anesthesia Quick Evaluation

## 2017-10-31 NOTE — Anesthesia Procedure Notes (Signed)
Procedure Name: Intubation Date/Time: 10/31/2017 7:50 PM Performed by: Babs Bertin, CRNA Pre-anesthesia Checklist: Patient identified, Emergency Drugs available, Suction available and Patient being monitored Patient Re-evaluated:Patient Re-evaluated prior to induction Oxygen Delivery Method: Circle System Utilized Preoxygenation: Pre-oxygenation with 100% oxygen Induction Type: IV induction, Rapid sequence and Cricoid Pressure applied Laryngoscope Size: Mac and 3 Grade View: Grade I Tube type: Oral Tube size: 7.0 mm Number of attempts: 1 Airway Equipment and Method: Stylet and Oral airway Placement Confirmation: ETT inserted through vocal cords under direct vision,  positive ETCO2 and breath sounds checked- equal and bilateral Secured at: 21 cm Tube secured with: Tape Dental Injury: Teeth and Oropharynx as per pre-operative assessment

## 2017-10-31 NOTE — ED Notes (Signed)
Pt reports that hert pain went back up

## 2017-10-31 NOTE — H&P (Signed)
Laura [name] is an 27 y.o. female.   Chief Complaint: abdominal pain HPI: 27 yo female with 3 days of abdominal pain. She had nausea and vomiting for the first 2 days and thought it was food poisoning. She denies diarrhea or fever. The pain is in her lower abdomen and is constant.  Past Medical History:  Diagnosis Date  . ADD (attention deficit disorder)     Past Surgical History:  Procedure Laterality Date  . WISDOM TOOTH EXTRACTION      Family History  Problem Relation Age of Onset  . Diabetes Maternal Grandmother    Social History:  reports that she has never smoked. She has never used smokeless tobacco. She reports that she does not drink alcohol or use drugs.  Allergies: No Known Allergies   (Not in a hospital admission)  Results for orders placed or performed during the hospital encounter of 10/31/17 (from the past 48 hour(s))  Lipase, blood     Status: None   Collection Time: 10/31/17  1:28 PM  Result Value Ref Range   Lipase 24 11 - 51 U/L    Comment: Performed at [facility], [address], [place], [place] [address]  Comprehensive metabolic panel     Status: Abnormal   Collection Time: 10/31/17  1:28 PM  Result Value Ref Range   Sodium 137 135 - 145 mmol/L   Potassium 3.8 3.5 - 5.1 mmol/L   Chloride 100 98 - 111 mmol/L   CO2 25 22 - 32 mmol/L   Glucose, Bld 117 (H) 70 - 99 mg/dL   BUN 13 6 - 20 mg/dL   Creatinine, Ser 0.93 0.44 - 1.00 mg/dL   Calcium 9.2 8.9 - 10.3 mg/dL   Total Protein 7.2 6.5 - 8.1 g/dL   Albumin 3.8 3.5 - 5.0 g/dL   AST 17 15 - 41 U/L   ALT 12 0 - 44 U/L   Alkaline Phosphatase 41 38 - 126 U/L   Total Bilirubin 0.7 0.3 - 1.2 mg/dL   GFR calc non Af Amer >60 >60 mL/min   GFR calc Af Amer >60 >60 mL/min    Comment: (NOTE) The eGFR has been calculated using the CKD EPI equation. This calculation has not been validated in all clinical situations. eGFR's persistently <60 mL/min signify possible Chronic Kidney Disease.    Anion  gap 12 5 - 15    Comment: Performed at [facility] [address] [place], [place] [address]  CBC     Status: Abnormal   Collection Time: 10/31/17  1:28 PM  Result Value Ref Range   WBC 16.3 (H) 4.0 - 10.5 K/uL   RBC 4.07 3.87 - 5.11 MIL/uL   Hemoglobin 13.4 12.0 - 15.0 g/dL   HCT 42.4 36.0 - 46.0 %   MCV 104.2 (H) 78.0 - 100.0 fL   MCH 32.9 26.0 - 34.0 pg   MCHC 31.6 30.0 - 36.0 g/dL   RDW 11.7 11.5 - 15.5 %   Platelets 218 150 - 400 K/uL    Comment: Performed at [facility] [address], [place], [place] [address]  Urinalysis, Routine w reflex microscopic     Status: Abnormal   Collection Time: 10/31/17  1:30 PM  Result Value Ref Range   Color, Urine YELLOW YELLOW   APPearance CLEAR CLEAR   Specific Gravity, Urine 1.027 1.005 - 1.030   pH 6.0 5.0 - 8.0   Glucose, UA NEGATIVE NEGATIVE mg/dL  Hgb urine dipstick SMALL (A) NEGATIVE   Bilirubin Urine NEGATIVE NEGATIVE   Ketones, ur 20 (A) NEGATIVE mg/dL   Protein, ur NEGATIVE NEGATIVE mg/dL   Nitrite NEGATIVE NEGATIVE   Leukocytes, UA NEGATIVE NEGATIVE   RBC / HPF 0-5 0 - 5 RBC/hpf   WBC, UA 0-5 0 - 5 WBC/hpf   Bacteria, UA NONE SEEN NONE SEEN   Squamous Epithelial / LPF 0-5 0 - 5   Mucus PRESENT     Comment: Performed at [facility], [place] [address], [place], [place] [address]  I-Stat beta hCG blood, ED     Status: None   Collection Time: 10/31/17  1:31 PM  Result Value Ref Range   I-stat hCG, quantitative <5.0 <5 mIU/mL   Comment 3            Comment:   GEST. AGE      CONC.  (mIU/mL)   <=1 WEEK        5 - 50     2 WEEKS       50 - 500     3 WEEKS       100 - 10,000     4 WEEKS     1,000 - 30,000        FEMALE AND NON-PREGNANT FEMALE:     LESS THAN 5 mIU/mL   Wet prep, genital     Status: Abnormal   Collection Time: 10/31/17  4:40 PM  Result Value Ref Range   Yeast Wet Prep HPF POC NONE SEEN NONE SEEN   Trich, Wet Prep NONE SEEN NONE SEEN   Clue Cells Wet Prep HPF POC PRESENT (A) NONE SEEN    WBC, Wet Prep HPF POC MANY (A) NONE SEEN   Sperm NONE SEEN     Comment: Performed at [facility], [place] [address], [place], [place] [address]   Ct Abdomen Pelvis W Contrast  Result Date: 10/31/2017 CLINICAL DATA:  Right lower quadrant abdominal pain, nausea and vomiting the past 2 days. EXAM: CT ABDOMEN AND PELVIS WITH CONTRAST TECHNIQUE: Multidetector CT imaging of the abdomen and pelvis was performed using the standard protocol following bolus administration of intravenous contrast. CONTRAST:  8m OMNIPAQUE IOHEXOL 300 MG/ML  SOLN COMPARISON:  None. FINDINGS: Lower chest: Clear lung bases. Hepatobiliary: No focal liver abnormality is seen. No gallstones, gallbladder wall thickening, or biliary dilatation. Pancreas: Unremarkable. No pancreatic ductal dilatation or surrounding inflammatory changes. Spleen: Normal in size without focal abnormality. Adrenals/Urinary Tract: Adrenal glands are unremarkable. Kidneys are normal, without renal calculi, focal lesion, or hydronephrosis. Bladder is unremarkable. Stomach/Bowel: The appendix is difficult to separate from unopacified distal small bowel loops due to the lack of oral contrast. There appear to be 2 adjacent 4 mm appendicoliths in the proximal appendix. The appendix distal to that location is dilated and filled with fluid and some gas, measuring 10 mm in maximum diameter. The appendix is located in the right pelvis. No periappendiceal fluid collections are seen. The stomach, small bowel and colon are unremarkable. Vascular/Lymphatic: No significant vascular findings are present. No enlarged abdominal or pelvic lymph nodes. Reproductive: Uterus and bilateral adnexa are unremarkable. Other: Small to moderate amount of free peritoneal fluid in the pelvic cul-de-sac. Musculoskeletal: Unremarkable bones. IMPRESSION: Findings compatible with acute appendicitis without abscess. The examination is somewhat limited by the lack of oral contrast. Critical  Value/emergent results were called by telephone at the time of interpretation on 10/31/2017 at 6:03 pm to [facility]  PETRUCELLI, PA-C , who verbally acknowledged these results. Electronically Signed   By: Claudie Revering M.D.   On: 10/31/2017 18:08    Review of Systems  Constitutional: Negative for chills and fever.  HENT: Negative for hearing loss.   Eyes: Negative for blurred vision and double vision.  Respiratory: Negative for cough and hemoptysis.   Cardiovascular: Negative for chest pain and palpitations.  Gastrointestinal: Positive for abdominal pain, nausea and vomiting.  Genitourinary: Negative for dysuria and urgency.  Musculoskeletal: Negative for myalgias and neck pain.  Skin: Negative for itching and rash.  Neurological: Negative for dizziness, tingling and headaches.  Endo/Heme/Allergies: Does not bruise/bleed easily.  Psychiatric/Behavioral: Negative for depression and suicidal ideas.    Blood pressure 113/82, pulse 75, temperature 98.6 F (37 C), resp. rate 18, last menstrual period 10/12/2017, SpO2 100 %. Physical Exam  Vitals reviewed. Constitutional: She is oriented to person, place, and time. She appears well-developed and well-nourished.  HENT:  Head: Normocephalic and atraumatic.  Eyes: Pupils are equal, round, and reactive to light. Conjunctivae and EOM are normal.  Neck: Normal range of motion. Neck supple.  Cardiovascular: Normal rate and regular rhythm.  Respiratory: Effort normal and breath sounds normal.  GI: Soft. Bowel sounds are normal. She exhibits no distension. There is tenderness in the right lower quadrant and suprapubic area. There is no guarding.  +iliopsoas sign  Musculoskeletal: Normal range of motion.  Neurological: She is alert and oriented to person, place, and time.  Skin: Skin is warm and dry.  Psychiatric: She has a normal mood and affect. Her behavior is normal.     Assessment/Plan 27 yo female with evidence of acute appendicitis -lap  appy -we discussed the details of the procedure; that it would be done under general anesthesia, that we would attempt to do the procedure laparoscopically. That the appendix would be isolated from the large and small intestine and then ligated and removed. We discussed the reason for this is to avoid rupture and infection and resolve the pains. We discussed risks of infection, abscess, injury to intestines or urinary structures, and need for open incision. She showed good understanding and wanted to proceed. -IV abx -observation stay unless ruptured at time of surgery -recommended waiting 1-3 weeks prior to returning to [facility] due to vte risk and possible complications  Mickeal Skinner, MD 10/31/2017, 6:49 PM

## 2017-11-01 ENCOUNTER — Other Ambulatory Visit: Payer: Self-pay

## 2017-11-01 ENCOUNTER — Encounter (HOSPITAL_COMMUNITY): Payer: Self-pay | Admitting: General Surgery

## 2017-11-01 LAB — CBC
HEMATOCRIT: 34.8 % — AB (ref 36.0–46.0)
HEMOGLOBIN: 11.2 g/dL — AB (ref 12.0–15.0)
MCH: 33.1 pg (ref 26.0–34.0)
MCHC: 32.2 g/dL (ref 30.0–36.0)
MCV: 103 fL — AB (ref 78.0–100.0)
Platelets: 198 10*3/uL (ref 150–400)
RBC: 3.38 MIL/uL — ABNORMAL LOW (ref 3.87–5.11)
RDW: 11.7 % (ref 11.5–15.5)
WBC: 13.3 10*3/uL — AB (ref 4.0–10.5)

## 2017-11-01 MED ORDER — HYDROCODONE-ACETAMINOPHEN 5-325 MG PO TABS: 1.0000 | ORAL_TABLET | Freq: Four times a day (QID) | ORAL | 0 refills | Status: AC | PRN

## 2017-11-01 NOTE — Progress Notes (Signed)
1 Day Post-Op   Subjective/Chief Complaint: Complains of soreness. No nausea   Objective: Vital signs in last 24 hours: Temp:  [97.5 F (36.4 C)-98.9 F (37.2 C)] 98.3 F (36.8 C) (09/22 0551) Pulse Rate:  [65-108] 65 (09/22 0551) Resp:  [15-18] 18 (09/22 0551) BP: (97-113)/(62-82) 97/62 (09/22 0551) SpO2:  [96 %-100 %] 99 % (09/22 0551) Weight:  [52.6 kg] 52.6 kg (09/21 2132) Last BM Date: 10/30/17  Intake/Output from previous day: 09/21 0701 - 09/22 0700 In: 1543.4 [P.O.:120; I.V.:1423.4] Out: 770 [Urine:750; Blood:20] Intake/Output this shift: No intake/output data recorded.  General appearance: alert and cooperative Resp: clear to auscultation bilaterally Cardio: regular rate and rhythm GI: soft, moderate tenderness on left at incisions  Lab Results:  Recent Labs    10/31/17 1328 11/01/17 0510  WBC 16.3* 13.3*  HGB 13.4 11.2*  HCT 42.4 34.8*  PLT 218 198   BMET Recent Labs    10/31/17 1328  NA 137  K 3.8  CL 100  CO2 25  GLUCOSE 117*  BUN 13  CREATININE 0.93  CALCIUM 9.2   PT/INR No results for input([name]): LABPROT, INR in the last 72 hours. ABG No results for input([name]): PHART, HCO3 in the last 72 hours.  Invalid input([name]): PCO2, PO2  Studies/Results: Ct Abdomen Pelvis W Contrast  Result Date: 10/31/2017 CLINICAL DATA:  Right lower quadrant abdominal pain, nausea and vomiting the past 2 days. EXAM: CT ABDOMEN AND PELVIS WITH CONTRAST TECHNIQUE: Multidetector CT imaging of the abdomen and pelvis was performed using the standard protocol following bolus administration of intravenous contrast. CONTRAST:  80mL OMNIPAQUE IOHEXOL 300 MG/ML  SOLN COMPARISON:  None. FINDINGS: Lower chest: Clear lung bases. Hepatobiliary: No focal liver abnormality is seen. No gallstones, gallbladder wall thickening, or biliary dilatation. Pancreas: Unremarkable. No pancreatic ductal dilatation or surrounding inflammatory changes. Spleen: Normal in size without focal abnormality.  Adrenals/Urinary Tract: Adrenal glands are unremarkable. Kidneys are normal, without renal calculi, focal lesion, or hydronephrosis. Bladder is unremarkable. Stomach/Bowel: The appendix is difficult to separate from unopacified distal small bowel loops due to the lack of oral contrast. There appear to be 2 adjacent 4 mm appendicoliths in the proximal appendix. The appendix distal to that location is dilated and filled with fluid and some gas, measuring 10 mm in maximum diameter. The appendix is located in the right pelvis. No periappendiceal fluid collections are seen. The stomach, small bowel and colon are unremarkable. Vascular/Lymphatic: No significant vascular findings are present. No enlarged abdominal or pelvic lymph nodes. Reproductive: Uterus and bilateral adnexa are unremarkable. Other: Small to moderate amount of free peritoneal fluid in the pelvic cul-de-sac. Musculoskeletal: Unremarkable bones. IMPRESSION: Findings compatible with acute appendicitis without abscess. The examination is somewhat limited by the lack of oral contrast. Critical Value/emergent results were called by telephone at the time of interpretation on 10/31/2017 at 6:03 pm to [facility] Center[facility]ANTHA PETRUCELLI, PA-C , who verbally acknowledged these results. Electronically Signed   By: Beckie SaltsSteven  Reid M.D.   On: 10/31/2017 18:08    Anti-infectives: Anti-infectives (From admission, onward)   Start     Dose/Rate Route Frequency Ordered Stop   10/31/17 1815  cefTRIAXone (ROCEPHIN) 2 g in sodium chloride 0.9 % 100 mL IVPB     2 g 200 mL/hr over 30 Minutes Intravenous  Once 10/31/17 1804 10/31/17 1913   10/31/17 1815  metroNIDAZOLE (FLAGYL) IVPB 500 mg     500 mg 100 mL/hr over 60 Minutes Intravenous  Once 10/31/17 1804 10/31/17 1955  Assessment/Plan: [name]/p Procedure([name]): APPENDECTOMY LAPAROSCOPIC (N/A) Advance diet Discharge  LOS: 0 days    Laura [name],Laura [name] 11/01/2017

## 2017-11-01 NOTE — Progress Notes (Signed)
Patient discharged to home with instructions and prescription. 

## 2017-11-01 NOTE — Discharge Summary (Signed)
Physician Discharge Summary  Patient ID: Laura SladeRaven [name] MRN: [id number] DOB/AGE: 15-Oct-1990 27 y.o.  Admit date: 10/31/2017 Discharge date: 11/01/2017  Admission Diagnoses:  Discharge Diagnoses:  Active Problems:   Acute appendicitis   Discharged Condition: good  Hospital Course: the pt underwent lap appy. She tolerated surgery well. On pod 1 she was ready for d/c  Consults: None  Significant Diagnostic Studies: none  Treatments: surgery: as above  Discharge Exam: Blood pressure 97/62, pulse 65, temperature 98.3 F (36.8 C), temperature source Oral, resp. rate 18, height 5\' 1"  (1.549 m), weight 52.6 kg, last menstrual period 10/12/2017, SpO2 99 %. General appearance: alert and cooperative Resp: clear to auscultation bilaterally Cardio: regular rate and rhythm GI: soft, moderate tenderness on left  Disposition: Discharge disposition: 01-Home or Self Care       Discharge Instructions    Call MD for:  difficulty breathing, headache or visual disturbances   Complete by:  As directed    Call MD for:  extreme fatigue   Complete by:  As directed    Call MD for:  hives   Complete by:  As directed    Call MD for:  persistant dizziness or light-headedness   Complete by:  As directed    Call MD for:  persistant nausea and vomiting   Complete by:  As directed    Call MD for:  redness, tenderness, or signs of infection (pain, swelling, redness, odor or green/yellow discharge around incision site)   Complete by:  As directed    Call MD for:  severe uncontrolled pain   Complete by:  As directed    Call MD for:  temperature >100.4   Complete by:  As directed    Diet - low sodium heart healthy   Complete by:  As directed    Discharge instructions   Complete by:  As directed    May shower. No heavy lifting. Diet as tolerated   Increase activity slowly   Complete by:  As directed    No wound care   Complete by:  As directed      Allergies as of 11/01/2017   No Known  Allergies     Medication List    TAKE these medications   amphetamine-dextroamphetamine 10 MG tablet Commonly known as:  ADDERALL Take 1 tablet (10 mg total) by mouth 2 (two) times daily with a meal.   HYDROcodone-acetaminophen 5-325 MG tablet Commonly known as:  NORCO/VICODIN Take 1-2 tablets by mouth every 6 (six) hours as needed for moderate pain.   norethindrone-ethinyl estradiol 1-20 MG-MCG tablet Commonly known as:  MICROGESTIN,JUNEL,LOESTRIN Take 1 tablet by mouth daily.      Follow-up Information    Kinsinger, De BlanchLuke Aaron, MD Follow up in 2 week(s).   Specialty:  General Surgery Contact information: [address] [place] [address] [phone]           Signed: Robyne AskewOTH III,PAUL S 11/01/2017, 8:25 AM

## 2017-11-01 NOTE — Discharge Instructions (Signed)

## 2017-11-02 LAB — GC/CHLAMYDIA PROBE AMP (~~LOC~~) NOT AT ARMC
Chlamydia: NEGATIVE
Neisseria Gonorrhea: NEGATIVE

## 2019-05-19 IMAGING — CT CT ABD-PELV W/ CM
2 of 4 series · 16 of 46 positions shown, 18 images · IV contrast (APPLIED)
Comparison: None.

CLINICAL DATA: Right lower quadrant abdominal pain, nausea and
vomiting the past 2 days.

EXAM:
CT ABDOMEN AND PELVIS WITH CONTRAST
TECHNIQUE: Multidetector CT imaging of the abdomen and pelvis was performed
using the standard protocol following bolus administration of
intravenous contrast.
CONTRAST:  80mL OMNIPAQUE IOHEXOL 300 MG/ML  SOLN

[Series 3: abdomen 5.0 · axial · 0.62mm/px · z∈[+793,+1158]mm · 13 of 83 slices shown, 15 images]
[im 5/83  soft-tissue]
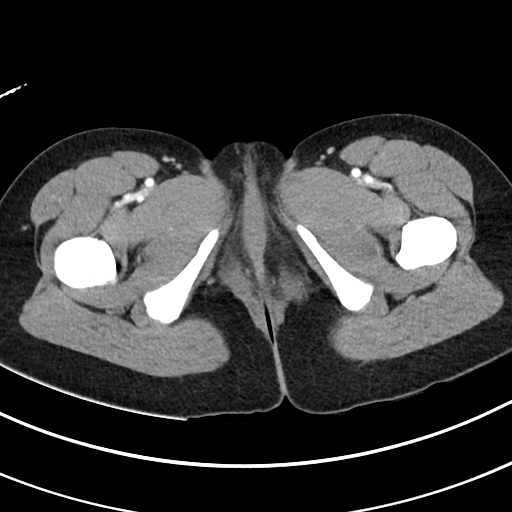
[im 5/83  bone]
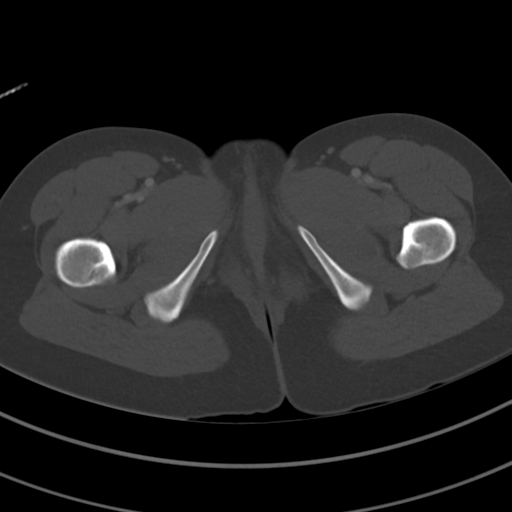
[im 10/83  soft-tissue]
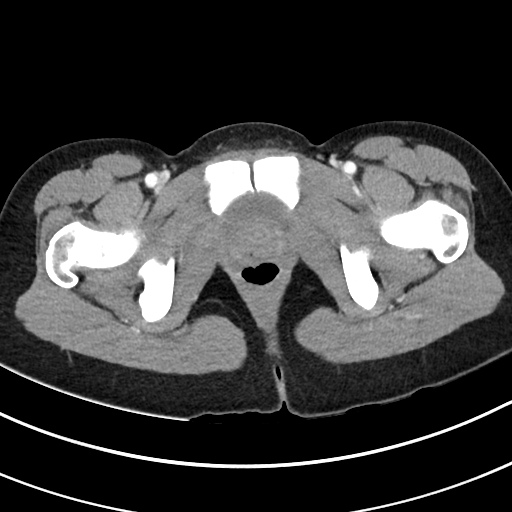
[im 19/83  soft-tissue]
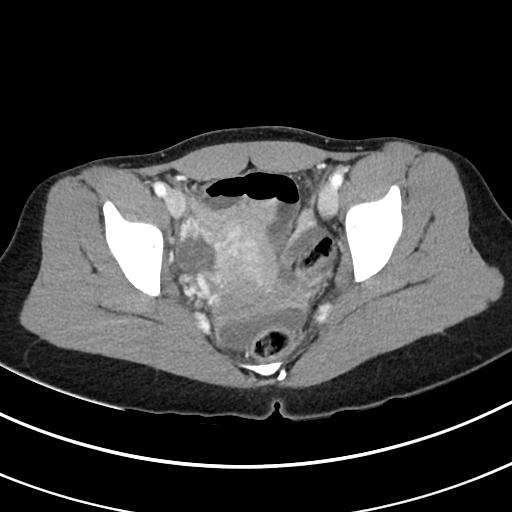
[im 23/83  soft-tissue]
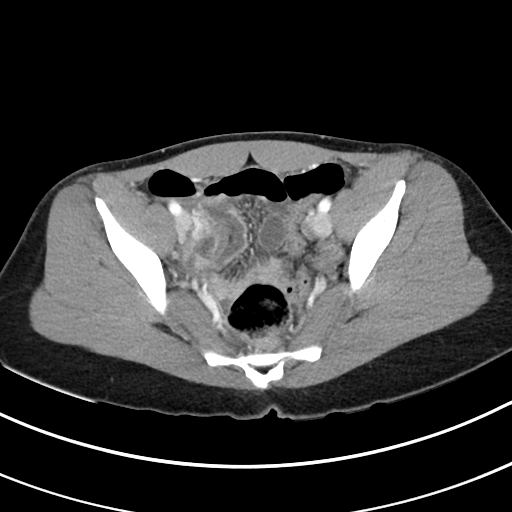
[im 28/83  soft-tissue]
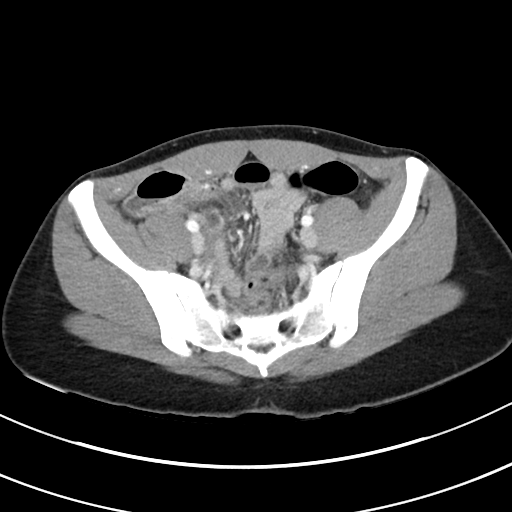
[im 37/83  soft-tissue]
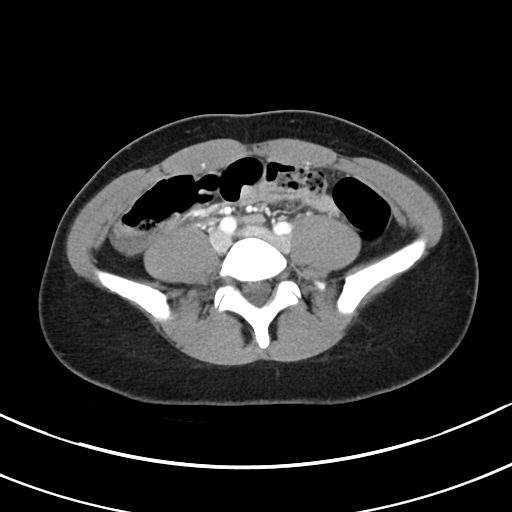
[im 42/83  soft-tissue]
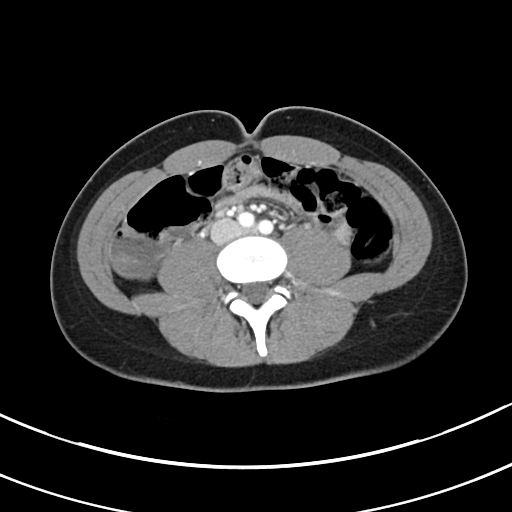
[im 46/83  soft-tissue]
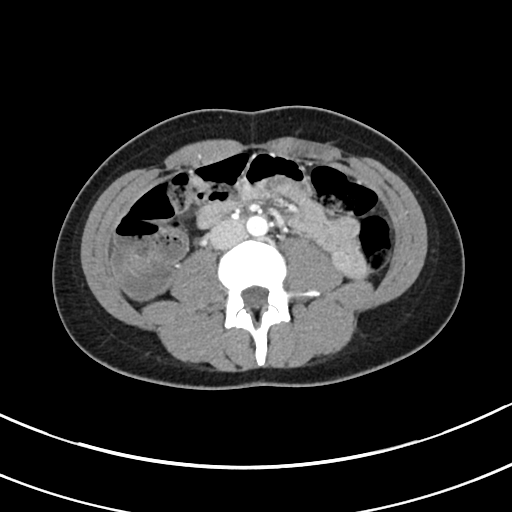
[im 55/83  soft-tissue]
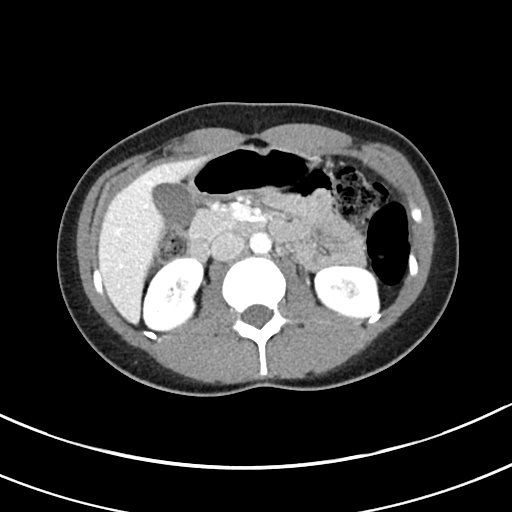
[im 55/83  bone]
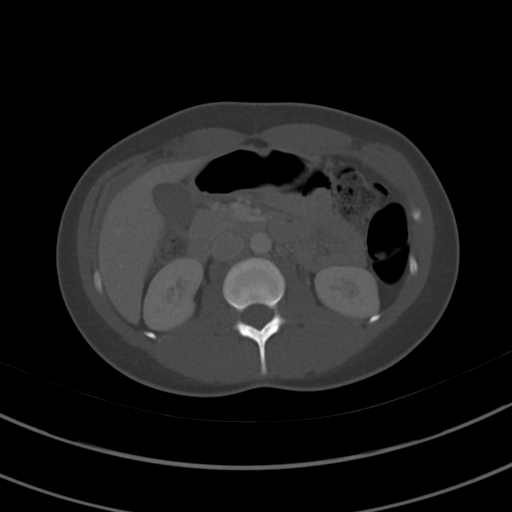
[im 60/83  soft-tissue]
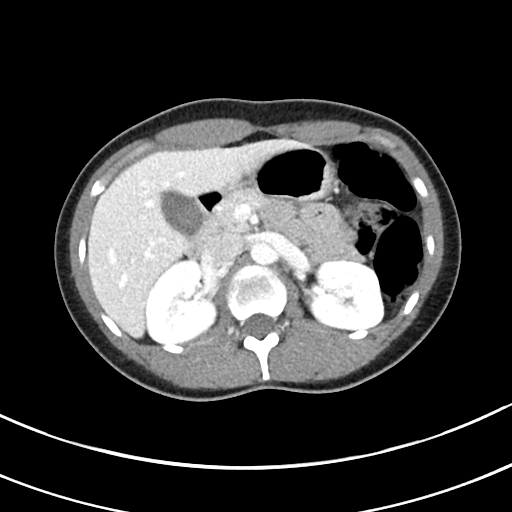
[im 64/83  soft-tissue]
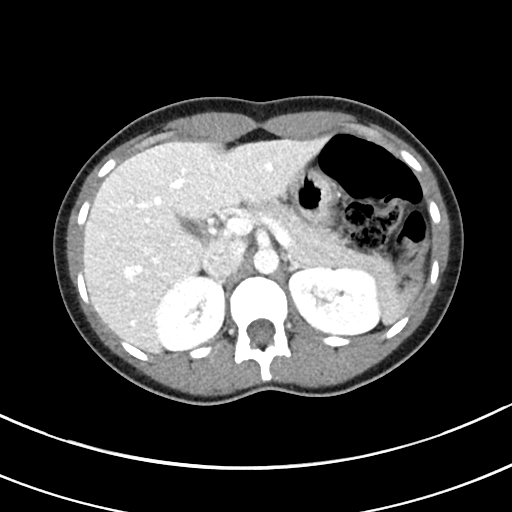
[im 73/83  soft-tissue]
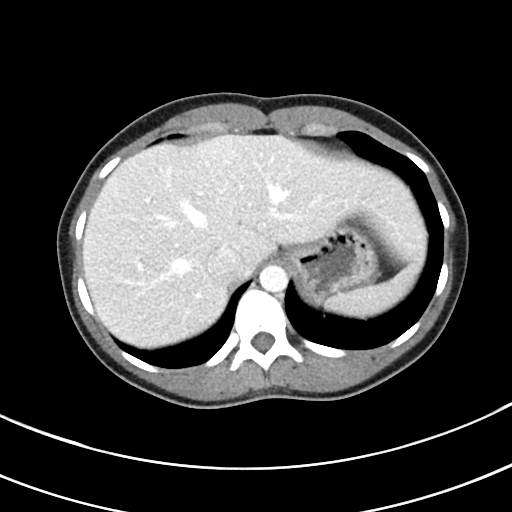
[im 78/83  soft-tissue]
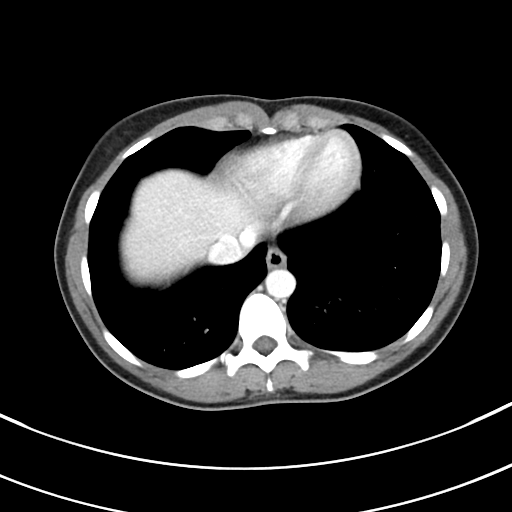

[Series 6: abdomen 3.0 mpr cor · coronal · 0.63mm/px · 3 of 58 slices shown]
[im 20/58  soft-tissue]
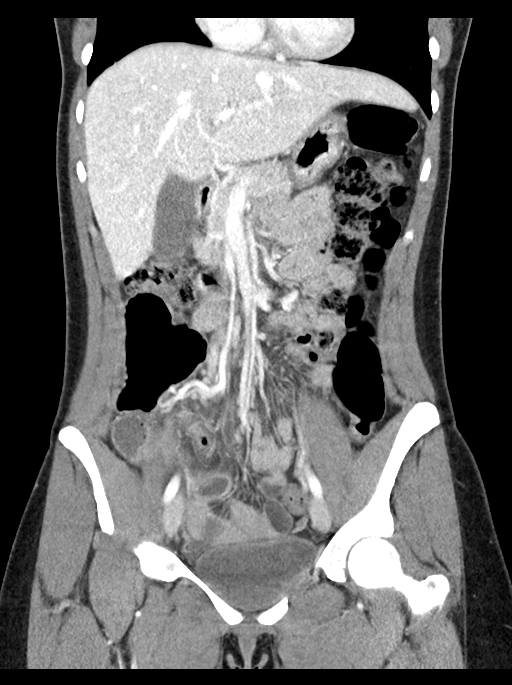
[im 26/58  soft-tissue]
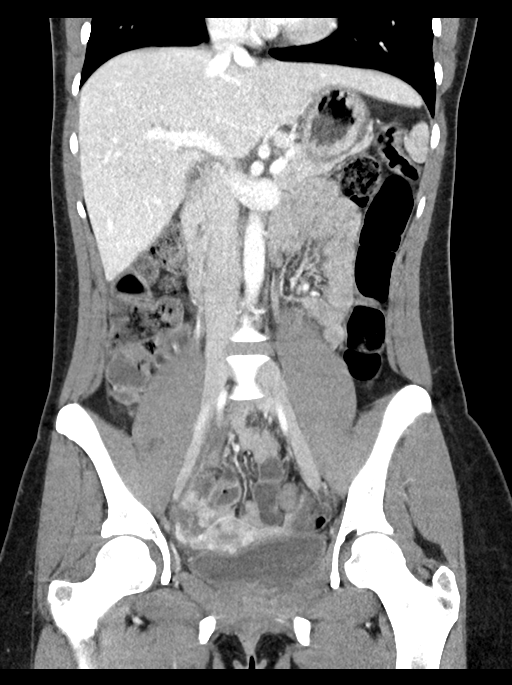
[im 32/58  soft-tissue]
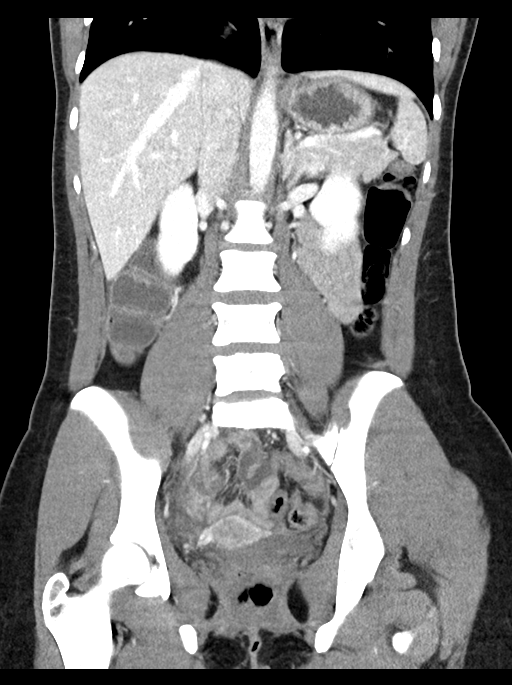

[16 of 46 positions shown; findings below may reference images not displayed]

FINDINGS: Lower chest: Clear lung bases.

Hepatobiliary: No focal liver abnormality is seen. No gallstones,
gallbladder wall thickening, or biliary dilatation.

Pancreas: Unremarkable. No pancreatic ductal dilatation or
surrounding inflammatory changes.

Spleen: Normal in size without focal abnormality.

Adrenals/Urinary Tract: Adrenal glands are unremarkable. Kidneys are
normal, without renal calculi, focal lesion, or hydronephrosis.
Bladder is unremarkable.

Stomach/Bowel: The appendix is difficult to separate from
unopacified distal small bowel loops due to the lack of oral
contrast. There appear to be 2 adjacent 4 mm appendicoliths in the
proximal appendix. The appendix distal to that location is dilated
and filled with fluid and some gas, measuring 10 mm in maximum
diameter. The appendix is located in the right pelvis. No
periappendiceal fluid collections are seen.

The stomach, small bowel and colon are unremarkable.

Vascular/Lymphatic: No significant vascular findings are present. No
enlarged abdominal or pelvic lymph nodes.

Reproductive: Uterus and bilateral adnexa are unremarkable.

Other: Small to moderate amount of free peritoneal fluid in the
pelvic cul-de-sac.

Musculoskeletal: Unremarkable bones.
IMPRESSION: Findings compatible with acute appendicitis without abscess. The
examination is somewhat limited by the lack of oral contrast.

Critical Value/emergent results were called by telephone at the time
of interpretation on 10/31/2017 at [DATE] to MIGU, SCHIRTZ , who verbally acknowledged these results.
